# Patient Record
Sex: Female | Born: 1995 | Race: Black or African American | Hispanic: No | Marital: Single | Smoking: Never smoker
Health system: Southern US, Community
[De-identification: ages and names within clinical notes are randomized; demographics above are authoritative.]

## PROBLEM LIST (undated history)

## (undated) DIAGNOSIS — Z789 Other specified health status: Secondary | ICD-10-CM

## (undated) HISTORY — PX: NO PAST SURGERIES: SHX2092

## (undated) HISTORY — PX: OTHER SURGICAL HISTORY: SHX169

## (undated) HISTORY — DX: Other specified health status: Z78.9

---
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
## 2013-01-17 ENCOUNTER — Emergency Department: Payer: Self-pay | Admitting: Emergency Medicine

## 2013-01-17 LAB — URINALYSIS, COMPLETE
Bilirubin,UR: NEGATIVE
Glucose,UR: NEGATIVE mg/dL (ref 0–75)
Ketone: NEGATIVE
Nitrite: NEGATIVE
Protein: 100
RBC,UR: 156 /HPF (ref 0–5)
Squamous Epithelial: 51
WBC UR: 50 /HPF (ref 0–5)

## 2015-01-07 ENCOUNTER — Emergency Department
Admit: 2015-01-07 | Disposition: A | Discharge status: Home / Self Care | Payer: Self-pay | Admitting: Emergency Medicine

## 2016-12-26 ENCOUNTER — Emergency Department
Admission: EM | Admit: 2016-12-26 | Discharge: 2016-12-26 | Disposition: A | Discharge status: Home / Self Care | Payer: Self-pay | Attending: Emergency Medicine | Admitting: Emergency Medicine

## 2016-12-26 ENCOUNTER — Encounter: Payer: Self-pay | Admitting: Emergency Medicine

## 2016-12-26 DIAGNOSIS — N946 Dysmenorrhea, unspecified: Secondary | ICD-10-CM | POA: Insufficient documentation

## 2016-12-26 MED ORDER — APAP-PAMABROM-PYRILAMINE 500-25-15 MG PO TABS: 1.0000 | ORAL_TABLET | Freq: Two times a day (BID) | ORAL | 0 refills | Status: DC | PRN

## 2016-12-26 NOTE — ED Provider Notes (Signed)
[facility] Emergency Department Provider Note  ____________________________________________  Time seen: Approximately 7:19 PM  I have reviewed the triage vital signs and the nursing notes.   HISTORY  Chief Complaint Menstrual Problem    HPI Deborah Strong is a 21 y.o. female who presents emergency department complaining of menstrual cramps. Patient reports that her menstrual cycle occurred on its normal schedule, has not experienced increased her heavy bleeding, no fevers or chills, dysuria, pyuria, hematuria, diarrhea, constipation. Patient reports that her cramps are more severe than normal. She states that she took Aleve earlier which did alleviate her cramps but states "I think I just need a prescription." No complaints at this time.No history of fibroids, endometriosis, ovarian cysts.   History reviewed. No pertinent past medical history.  There are no active problems to display for this patient.   History reviewed. No pertinent surgical history.  Prior to Admission medications   Not on File    Allergies Patient has no known allergies.  History reviewed. No pertinent family history.  Social History Social History  Substance Use Topics  . Smoking status: Never Smoker  . Smokeless tobacco: Never Used  . Alcohol use Yes     Review of Systems  Constitutional: No fever/chills Cardiovascular: no chest pain. Respiratory: no cough. No SOB. Gastrointestinal: No abdominal pain.  No nausea, no vomiting.  No diarrhea.  No constipation. Genitourinary: Negative for dysuria. No hematuria. Positive for menstrual cramps Musculoskeletal: Negative for musculoskeletal pain. Skin: Negative for rash, abrasions, lacerations, ecchymosis. Neurological: Negative for headaches, focal weakness or numbness. 10-point ROS otherwise negative.  ____________________________________________   PHYSICAL EXAM:  VITAL SIGNS: ED Triage Vitals  Enc Vitals Group     BP 12/26/16 1826 126/76     Pulse Rate 12/26/16 1826 89     Resp 12/26/16 1826 19     Temp 12/26/16 1826 99.3 F (37.4 C)     Temp Source 12/26/16 1826 Oral     SpO2 12/26/16 1826 100 %     Weight 12/26/16 1832 180 lb (81.6 kg)     Height 12/26/16 1832 5\' 6"  (1.676 m)     Head Circumference --      Peak Flow --      Pain Score 12/26/16 1832 6     Pain Loc --      Pain Edu? --      Excl. in GC? --      Constitutional: Alert and oriented. Well appearing and in no acute distress. Eyes: Conjunctivae are normal. PERRL. EOMI. Head: Atraumatic. Neck: No stridor.    Cardiovascular: Normal rate, regular rhythm. Normal S1 and S2.  Good peripheral circulation. Respiratory: Normal respiratory effort without tachypnea or retractions. Lungs CTAB. Good air entry to the bases with no decreased or absent breath sounds. Gastrointestinal: Bowel sounds 4 quadrants. Soft and nontender to palpation. No guarding or rigidity. No palpable masses. No distention. No CVA tenderness. Musculoskeletal: Full range of motion to all extremities. No gross deformities appreciated. Neurologic:  Normal speech and language. No gross focal neurologic deficits are appreciated.  Skin:  Skin is warm, dry and intact. No rash noted. Psychiatric: Mood and affect are normal. Speech and behavior are normal. Patient exhibits appropriate insight and judgement.   ____________________________________________   LABS (all labs ordered are listed, but only abnormal results are displayed)  Labs Reviewed - No data to display ____________________________________________  EKG   ____________________________________________  RADIOLOGY   No results found.  ____________________________________________  PROCEDURES  Procedure(s) performed:    Procedures    Medications - No data to display   ____________________________________________   INITIAL IMPRESSION / ASSESSMENT AND PLAN / ED COURSE  Pertinent labs &  imaging results that were available during my care of the patient were reviewed by me and considered in my medical decision making (see chart for details).  Review of the [place] CSRS was performed in accordance of the [facility] prior to dispensing any controlled drugs.     Patient's diagnosis is consistent with menstrual cramps. Patient is here for increased menstrual cramps with no other complaints. Other than cramping, no changes from baseline menses. Exam was reassuring with no indication for imaging or labs. Patient reports that Aleve alleviated her symptoms but she is requesting prescription.. Patient will be discharged home with prescriptions for Pamprin. Patient is to follow up with primary care or OB/GYN as needed or otherwise directed. Patient is given ED precautions to return to the ED for any worsening or new symptoms.     ____________________________________________  FINAL CLINICAL IMPRESSION(S) / ED DIAGNOSES  Final diagnoses:  None      NEW MEDICATIONS STARTED DURING THIS VISIT:  New Prescriptions   No medications on file        This chart was dictated using voice recognition software/Dragon. Despite best efforts to proofread, errors can occur which can change the meaning. Any change was purely unintentional.   Racheal PatchesJonathan D Diania Co, PA-C 12/26/16 1926    Sharman CheekPhillip Stafford, MD 12/26/16 2329

## 2016-12-26 NOTE — ED Notes (Signed)
Pt reports cramps with menses.  Sx began 2 days ago.  Pt states took aleve with some relief.  Pt also reports nausea.  No increase in vag bleeding.  No dizziness   Pt alert.

## 2016-12-26 NOTE — ED Triage Notes (Signed)
Pt presents to ED c/o increased cramping during her menstrual period . Pt states it is more painful than normal, bleeding is the same;denies dizziness. Pt admits to longer cramps than usual. Denies hx of cysts, fibroids. Pt has taken aleve with some relief.

## 2019-10-09 NOTE — L&D Delivery Note (Signed)
       Delivery Note   Deborah Strong is a 24 y.o. G1P0 at [redacted]w[redacted]d Estimated Date of Delivery: 05/13/20  PRE-OPERATIVE DIAGNOSIS:  1) [redacted]w[redacted]d pregnancy.  2) labor  POST-OPERATIVE DIAGNOSIS:  1) [redacted]w[redacted]d pregnancy s/p Vaginal, Spontaneous    Delivery Type: Vaginal, Spontaneous    Delivery Anesthesia: Local   Labor Complications:  Precipitate delivery    ESTIMATED BLOOD LOSS: 200  ml    FINDINGS:   1) female infant, Apgar scores of 8   at 1 minute and 9   at 5 minutes and a birthweight of 109  ounces.    2) Nuchal cord: Arm cord  SPECIMENS:   PLACENTA:   Appearance: Intact    Removal: Spontaneous      Disposition:    DISPOSITION:  Infant to left in stable condition in the delivery room, with L&D personnel and mother,  NARRATIVE SUMMARY: Labor course:  Ms. Deborah Strong is a G1P0 at [redacted]w[redacted]d who presented for labor management.  She progressed well in labor without pitocin.  She received the appropriate anesthesia and proceeded to complete dilation. She evidenced good maternal expulsive effort during the second stage. She went on to deliver a viable infant. The placenta delivered without problems and was noted to be complete. A perineal and vaginal examination was performed. Episiotomy/Lacerations: 1st degree  Episiotomy or lacerations were repaired with Vicryl suture using local anesthesia. The patient received IV stadol prior to repair.  Elonda Husky, M.D. 05/05/2020 5:11 AM

## 2019-10-15 ENCOUNTER — Ambulatory Visit (LOCAL_COMMUNITY_HEALTH_CENTER): Payer: Medicaid Other

## 2019-10-15 ENCOUNTER — Telehealth: Payer: Self-pay | Admitting: Obstetrics and Gynecology

## 2019-10-15 ENCOUNTER — Other Ambulatory Visit: Payer: Self-pay

## 2019-10-15 VITALS — BP 115/80 | Ht 66.0 in | Wt 215.5 lb

## 2019-10-15 DIAGNOSIS — Z3201 Encounter for pregnancy test, result positive: Secondary | ICD-10-CM

## 2019-10-15 LAB — PREGNANCY, URINE: Preg Test, Ur: POSITIVE — AB

## 2019-10-15 NOTE — Telephone Encounter (Signed)
pt  Called in and [facility] lmp 07/27/2020. Pt requested cherry. Where would you let to put her. Please let me know pt was 12 week 1/5/

## 2019-10-15 NOTE — Progress Notes (Signed)
PNV declined as plans to purchase gummy PNV. Client reports LMP 08/18/2019 as 3 days of light spotting. Thinks LNMP was in September 2020. Plans [facility] as [facility] provider. PHQ 9 score = 7. Provider consult offered and client declined. Jossie Ng, RN

## 2019-10-16 NOTE — Telephone Encounter (Signed)
AC has one appointment next week its on a Friday maybe we can get her in to that appointment.  Thanks [employer]

## 2019-10-19 NOTE — Telephone Encounter (Signed)
Pt called in and spoke to dejunna pt schud appt

## 2019-11-04 ENCOUNTER — Other Ambulatory Visit: Payer: Medicaid Other

## 2019-11-04 ENCOUNTER — Other Ambulatory Visit: Payer: Self-pay

## 2019-11-04 ENCOUNTER — Other Ambulatory Visit: Payer: Self-pay | Admitting: Obstetrics and Gynecology

## 2019-11-04 DIAGNOSIS — Z789 Other specified health status: Secondary | ICD-10-CM

## 2019-11-05 ENCOUNTER — Other Ambulatory Visit: Payer: Self-pay

## 2019-11-05 ENCOUNTER — Ambulatory Visit (INDEPENDENT_AMBULATORY_CARE_PROVIDER_SITE_OTHER): Payer: Self-pay

## 2019-11-05 ENCOUNTER — Other Ambulatory Visit: Payer: Medicaid Other

## 2019-11-05 DIAGNOSIS — O3680X0 Pregnancy with inconclusive fetal viability, not applicable or unspecified: Secondary | ICD-10-CM

## 2019-11-05 DIAGNOSIS — Z3A13 13 weeks gestation of pregnancy: Secondary | ICD-10-CM

## 2019-11-05 DIAGNOSIS — Z789 Other specified health status: Secondary | ICD-10-CM

## 2019-11-05 NOTE — Progress Notes (Signed)
Pt present for NOB-PE. Nurse intake information and labs completed today during PE visit. Pt LMP unknown. Pt stated having lower back pain. Pt completed genetic testing blood work today.

## 2019-11-05 NOTE — Patient Instructions (Addendum)
WHAT OB PATIENTS CAN EXPECT   Confirmation of pregnancy and ultrasound ordered if medically indicated-[redacted] weeks gestation  New OB (NOB) intake with nurse and New OB (NOB) labs- [redacted] weeks gestation  New OB (NOB) physical examination with provider- 11/[redacted] weeks gestation  Flu vaccine-[redacted] weeks gestation  Anatomy scan-[redacted] weeks gestation  Glucose tolerance test, blood work to test for anemia, T-dap vaccine-[redacted] weeks gestation  Vaginal swabs/cultures-STD/Group B strep-[redacted] weeks gestation  Appointments every 4 weeks until 28 weeks  Every 2 weeks from 28 weeks until 36 weeks  Weekly visits from 36 weeks until delivery   Second Trimester of Pregnancy  The second trimester is from week 14 through week 27 (month 4 through 6). This is often the time in pregnancy that you feel your best. Often times, morning sickness has lessened or quit. You may have more energy, and you may get hungry more often. Your unborn baby is growing rapidly. At the end of the sixth month, he or she is about 9 inches long and weighs about 1 pounds. You will likely feel the baby move between 18 and 20 weeks of pregnancy. Follow these instructions at home: Medicines  Take over-the-counter and prescription medicines only as told by your doctor. Some medicines are safe and some medicines are not safe during pregnancy.  Take a prenatal vitamin that contains at least 600 micrograms (mcg) of folic acid.  If you have trouble pooping (constipation), take medicine that will make your stool soft (stool softener) if your doctor approves. Eating and drinking   Eat regular, healthy meals.  Avoid raw meat and uncooked cheese.  If you get low calcium from the food you eat, talk to your doctor about taking a daily calcium supplement.  Avoid foods that are high in fat and sugars, such as fried and sweet foods.  If you feel sick to your stomach (nauseous) or throw up (vomit): ? Eat 4 or 5 small meals a day instead of 3 large  meals. ? Try eating a few soda crackers. ? Drink liquids between meals instead of during meals.  To prevent constipation: ? Eat foods that are high in fiber, like fresh fruits and vegetables, whole grains, and beans. ? Drink enough fluids to keep your pee (urine) clear or pale yellow. Activity  Exercise only as told by your doctor. Stop exercising if you start to have cramps.  Do not exercise if it is too hot, too humid, or if you are in a place of great height (high altitude).  Avoid heavy lifting.  Wear low-heeled shoes. Sit and stand up straight.  You can continue to have sex unless your doctor tells you not to. Relieving pain and discomfort  Wear a good support bra if your breasts are tender.  Take warm water baths (sitz baths) to soothe pain or discomfort caused by hemorrhoids. Use hemorrhoid cream if your doctor approves.  Rest with your legs raised if you have leg cramps or low back pain.  If you develop puffy, bulging veins (varicose veins) in your legs: ? Wear support hose or compression stockings as told by your doctor. ? Raise (elevate) your feet for 15 minutes, 3-4 times a day. ? Limit salt in your food. Prenatal care  Write down your questions. Take them to your prenatal visits.  Keep all your prenatal visits as told by your doctor. This is important. Safety  Wear your seat belt when driving.  Make a list of emergency phone numbers, including numbers for family, friends,  the hospital, and police and fire departments. General instructions  Ask your doctor about the right foods to eat or for help finding a counselor, if you need these services.  Ask your doctor about local prenatal classes. Begin classes before month 6 of your pregnancy.  Do not use hot tubs, steam rooms, or saunas.  Do not douche or use tampons or scented sanitary pads.  Do not cross your legs for long periods of time.  Visit your dentist if you have not done so. Use a soft toothbrush  to brush your teeth. Floss gently.  Avoid all smoking, herbs, and alcohol. Avoid drugs that are not approved by your doctor.  Do not use any products that contain nicotine or tobacco, such as cigarettes and e-cigarettes. If you need help quitting, ask your doctor.  Avoid cat litter boxes and soil used by cats. These carry germs that can cause birth defects in the baby and can cause a loss of your baby (miscarriage) or stillbirth. Contact a doctor if:  You have mild cramps or pressure in your lower belly.  You have pain when you pee (urinate).  You have bad smelling fluid coming from your vagina.  You continue to feel sick to your stomach (nauseous), throw up (vomit), or have watery poop (diarrhea).  You have a nagging pain in your belly area.  You feel dizzy. Get help right away if:  You have a fever.  You are leaking fluid from your vagina.  You have spotting or bleeding from your vagina.  You have severe belly cramping or pain.  You lose or gain weight rapidly.  You have trouble catching your breath and have chest pain.  You notice sudden or extreme puffiness (swelling) of your face, hands, ankles, feet, or legs.  You have not felt the baby move in over an hour.  You have severe headaches that do not go away when you take medicine.  You have trouble seeing. Summary  The second trimester is from week 14 through week 27 (months 4 through 6). This is often the time in pregnancy that you feel your best.  To take care of yourself and your unborn baby, you will need to eat healthy meals, take medicines only if your doctor tells you to do so, and do activities that are safe for you and your baby.  Call your doctor if you get sick or if you notice anything unusual about your pregnancy. Also, call your doctor if you need help with the right food to eat, or if you want to know what activities are safe for you. This information is not intended to replace advice given to you by  your health care provider. Make sure you discuss any questions you have with your health care provider. Document Revised: 01/16/2019 Document Reviewed: 10/30/2016 Elsevier Patient Education  2020 [employer]. Prenatal Care Prenatal care is health care during pregnancy. It helps you and your unborn baby (fetus) stay as healthy as possible. Prenatal care may be provided by a midwife, a family practice health care provider, or a childbirth and pregnancy specialist (obstetrician). How does this affect me? During pregnancy, you will be closely monitored for any new conditions that might develop. To lower your risk of pregnancy complications, you and your health care provider will talk about any underlying conditions you have. How does this affect my baby? Early and consistent prenatal care increases the chance that your baby will be healthy during pregnancy. Prenatal care lowers the risk that  your baby will be:  Born early (prematurely).  Smaller than expected at birth (small for gestational age). What can I expect at the first prenatal care visit? Your first prenatal care visit will likely be the longest. You should schedule your first prenatal care visit as soon as you know that you are pregnant. Your first visit is a good time to talk about any questions or concerns you have about pregnancy. At your visit, you and your health care provider will talk about:  Your medical history, including: ? Any past pregnancies. ? Your family's medical history. ? The baby's father's medical history. ? Any long-term (chronic) health conditions you have and how you manage them. ? Any surgeries or procedures you have had. ? Any current over-the-counter or prescription medicines, herbs, or supplements you are taking.  Other factors that could pose a risk to your baby, including:  Your home setting and your stress levels, including: ? Exposure to abuse or violence. ? Household financial strain. ? Mental  health conditions you have.  Your daily health habits, including diet and exercise. Your health care provider will also:  Measure your weight, height, and blood pressure.  Do a physical exam, including a pelvic and breast exam.  Perform blood tests and urine tests to check for: ? Urinary tract infection. ? Sexually transmitted infections (STIs). ? Low iron levels in your blood (anemia). ? Blood type and certain proteins on red blood cells (Rh antibodies). ? Infections and immunity to viruses, such as hepatitis B and rubella. ? HIV (human immunodeficiency virus).  Do an ultrasound to confirm your baby's growth and development and to help predict your estimated due date (EDD). This ultrasound is done with a probe that is inserted into the vagina (transvaginal ultrasound).  Discuss your options for genetic screening.  Give you information about how to keep yourself and your baby healthy, including: ? Nutrition and taking vitamins. ? Physical activity. ? How to manage pregnancy symptoms such as nausea and vomiting (morning sickness). ? Infections and substances that may be harmful to your baby and how to avoid them. ? Food safety. ? Dental care. ? Working. ? Travel. ? Warning signs to watch for and when to call your health care provider. How often will I have prenatal care visits? After your first prenatal care visit, you will have regular visits throughout your pregnancy. The visit schedule is often as follows:  Up to week 28 of pregnancy: once every 4 weeks.  28-36 weeks: once every 2 weeks.  After 36 weeks: every week until delivery. Some women may have visits more or less often depending on any underlying health conditions and the health of the baby. Keep all follow-up and prenatal care visits as told by your health care provider. This is important. What happens during routine prenatal care visits? Your health care provider will:  Measure your weight and blood  pressure.  Check for fetal heart sounds.  Measure the height of your uterus in your abdomen (fundal height). This may be measured starting around week 20 of pregnancy.  Check the position of your baby inside your uterus.  Ask questions about your diet, sleeping patterns, and whether you can feel the baby move.  Review warning signs to watch for and signs of labor.  Ask about any pregnancy symptoms you are having and how you are dealing with them. Symptoms may include: ? Headaches. ? Nausea and vomiting. ? Vaginal discharge. ? Swelling. ? Fatigue. ? Constipation. ? Any discomfort, including  back or pelvic pain. Make a list of questions to ask your health care provider at your routine visits. What tests might I have during prenatal care visits? You may have blood, urine, and imaging tests throughout your pregnancy, such as:  Urine tests to check for glucose, protein, or signs of infection.  Glucose tests to check for a form of diabetes that can develop during pregnancy (gestational diabetes mellitus). This is usually done around week 24 of pregnancy.  An ultrasound to check your baby's growth and development and to check for birth defects. This is usually done around week 20 of pregnancy.  A test to check for group B strep (GBS) infection. This is usually done around week 36 of pregnancy.  Genetic testing. This may include blood or imaging tests, such as an ultrasound. Some genetic tests are done during the first trimester and some are done during the second trimester. What else can I expect during prenatal care visits? Your health care provider may recommend getting certain vaccines during pregnancy. These may include:  A yearly flu shot (annual influenza vaccine). This is especially important if you will be pregnant during flu season.  Tdap (tetanus, diphtheria, pertussis) vaccine. Getting this vaccine during pregnancy can protect your baby from whooping cough (pertussis) after  birth. This vaccine may be recommended between weeks 27 and 36 of pregnancy. Later in your pregnancy, your health care provider may give you information about:  Childbirth and breastfeeding classes.  Choosing a health care provider for your baby.  Umbilical cord banking.  Breastfeeding.  Birth control after your baby is born.  The hospital labor and delivery unit and how to tour it.  Registering at the hospital before you go into labor. Where to find more information  Office on Women's Health: LegalWarrants.gl  American Pregnancy Association: americanpregnancy.org  March of Dimes: marchofdimes.org Summary  Prenatal care helps you and your baby stay as healthy as possible during pregnancy.  Your first prenatal care visit will most likely be the longest.  You will have visits and tests throughout your pregnancy to monitor your health and your baby's health.  Bring a list of questions to your visits to ask your health care provider.  Make sure to keep all follow-up and prenatal care visits with your health care provider. This information is not intended to replace advice given to you by your health care provider. Make sure you discuss any questions you have with your health care provider. Document Revised: 01/14/2019 Document Reviewed: 09/23/2017 Elsevier Patient Education  2020 [employer].   How a Baby Grows During Pregnancy  Pregnancy begins when a female's sperm enters a female's egg (fertilization). Fertilization usually happens in one of the tubes (fallopian tubes) that connect the ovaries to the womb (uterus). The fertilized egg moves down the fallopian tube to the uterus. Once it reaches the uterus, it implants into the lining of the uterus and begins to grow. For the first 10 weeks, the fertilized egg is called an embryo. After 10 weeks, it is called a fetus. As the fetus continues to grow, it receives oxygen and nutrients through tissue (placenta) that grows to  support the developing baby. The placenta is the life support system for the baby. It provides oxygen and nutrition and removes waste. Learning as much as you can about your pregnancy and how your baby is developing can help you enjoy the experience. It can also make you aware of when there might be a problem and when to ask  questions. How long does a typical pregnancy last? A pregnancy usually lasts 280 days, or about 40 weeks. Pregnancy is divided into three periods of growth, also called trimesters:  First trimester: 0-12 weeks.  Second trimester: 13-27 weeks.  Third trimester: 28-40 weeks. The day when your baby is ready to be born (full term) is your estimated date of delivery. How does my baby develop month by month? First month  The fertilized egg attaches to the inside of the uterus.  Some cells will form the placenta. Others will form the fetus.  The arms, legs, brain, spinal cord, lungs, and heart begin to develop.  At the end of the first month, the heart begins to beat. Second month  The bones, inner ear, eyelids, hands, and feet form.  The genitals develop.  By the end of 8 weeks, all major organs are developing. Third month  All of the internal organs are forming.  Teeth develop below the gums.  Bones and muscles begin to grow. The spine can flex.  The skin is transparent.  Fingernails and toenails begin to form.  Arms and legs continue to grow longer, and hands and feet develop.  The fetus is about 3 inches (7.6 cm) long. Fourth month  The placenta is completely formed.  The external sex organs, neck, outer ear, eyebrows, eyelids, and fingernails are formed.  The fetus can hear, swallow, and move its arms and legs.  The kidneys begin to produce urine.  The skin is covered with a white, waxy coating (vernix) and very fine hair (lanugo). Fifth month  The fetus moves around more and can be felt for the first time (quickening).  The fetus starts to  sleep and wake up and may begin to suck its finger.  The nails grow to the end of the fingers.  The organ in the digestive system that makes bile (gallbladder) functions and helps to digest nutrients.  If your baby is a girl, eggs are present in her ovaries. If your baby is a boy, testicles start to move down into his scrotum. Sixth month  The lungs are formed.  The eyes open. The brain continues to develop.  Your baby has fingerprints and toe prints. Your baby's hair grows thicker.  At the end of the second trimester, the fetus is about 9 inches (22.9 cm) long. Seventh month  The fetus kicks and stretches.  The eyes are developed enough to sense changes in light.  The hands can make a grasping motion.  The fetus responds to sound. Eighth month  All organs and body systems are fully developed and functioning.  Bones harden, and taste buds develop. The fetus may hiccup.  Certain areas of the brain are still developing. The skull remains soft. Ninth month  The fetus gains about  lb (0.23 kg) each week.  The lungs are fully developed.  Patterns of sleep develop.  The fetus's head typically moves into a head-down position (vertex) in the uterus to prepare for birth.  The fetus weighs 6-9 lb (2.72-4.08 kg) and is 19-20 inches (48.26-50.8 cm) long. What can I do to have a healthy pregnancy and help my baby develop? General instructions  Take prenatal vitamins as directed by your health care provider. These include vitamins such as folic acid, iron, calcium, and vitamin D. They are important for healthy development.  Take medicines only as directed by your health care provider. Read labels and ask a pharmacist or your health care provider whether over-the-counter medicines, supplements,  and prescription drugs are safe to take during pregnancy.  Keep all follow-up visits as directed by your health care provider. This is important. Follow-up visits include prenatal care and  screening tests. How do I know if my baby is developing well? At each prenatal visit, your health care provider will do several different tests to check on your health and keep track of your baby's development. These include:  Fundal height and position. ? Your health care provider will measure your growing belly from your pubic bone to the top of the uterus using a tape measure. ? Your health care provider will also feel your belly to determine your baby's position.  Heartbeat. ? An ultrasound in the first trimester can confirm pregnancy and show a heartbeat, depending on how far along you are. ? Your health care provider will check your baby's heart rate at every prenatal visit.  Second trimester ultrasound. ? This ultrasound checks your baby's development. It also may show your baby's gender. What should I do if I have concerns about my baby's development? Always talk with your health care provider about any concerns that you may have about your pregnancy and your baby. Summary  A pregnancy usually lasts 280 days, or about 40 weeks. Pregnancy is divided into three periods of growth, also called trimesters.  Your health care provider will monitor your baby's growth and development throughout your pregnancy.  Follow your health care provider's recommendations about taking prenatal vitamins and medicines during your pregnancy.  Talk with your health care provider if you have any concerns about your pregnancy or your developing baby. This information is not intended to replace advice given to you by your health care provider. Make sure you discuss any questions you have with your health care provider. Document Revised: 01/15/2019 Document Reviewed: 08/07/2017 Elsevier Patient Education  [place].   Common Medications Safe in Pregnancy  Acne:      Constipation:  Benzoyl Peroxide     Colace  Clindamycin      Dulcolax Suppository  Topica Erythromycin     Fibercon  Salicylic  Acid      Metamucil         Miralax AVOID:        Senakot   Accutane    Cough:  Retin-A       Cough Drops  Tetracycline      Phenergan w/ Codeine if Rx  Minocycline      Robitussin (Plain & DM)  Antibiotics:     Crabs/Lice:  Ceclor       RID  Cephalosporins    AVOID:  E-Mycins      Kwell  Keflex  Macrobid/Macrodantin   Diarrhea:  Penicillin      Kao-Pectate  Zithromax      Imodium AD         PUSH FLUIDS AVOID:       Cipro     Fever:  Tetracycline      Tylenol (Regular or Extra  Minocycline       Strength)  Levaquin      Extra Strength-Do not          Exceed 8 tabs/24 hrs Caffeine:        <25m/day (equiv. To 1 cup of coffee or  approx. 3 12 oz sodas)         Gas: Cold/Hayfever:       Gas-X  Benadryl      Mylicon  Claritin  Phazyme  **Claritin-D        Chlor-Trimeton    Headaches:  Dimetapp      ASA-Free Excedrin  Drixoral-Non-Drowsy     Cold Compress  Mucinex (Guaifenasin)     Tylenol (Regular or Extra  Sudafed/Sudafed-12 Hour     Strength)  **Sudafed PE Pseudoephedrine   Tylenol Cold & Sinus     Vicks Vapor Rub  Zyrtec  **AVOID if Problems With Blood Pressure    Heartburn: Avoid lying down for at least 1 hour after meals  Aciphex      Maalox     Rash:  Milk of Magnesia     Benadryl    Mylanta       1% Hydrocortisone Cream  Pepcid  Pepcid Complete   Sleep Aids:  Prevacid      Ambien   Prilosec       Benadryl  Rolaids       Chamomile Tea  Tums (Limit 4/day)     Unisom  Zantac       Tylenol PM         Warm milk-add vanilla or  Hemorrhoids:       Sugar for taste  Anusol/Anusol H.C.  (RX: Analapram 2.5%)  Sugar Substitutes:  Hydrocortisone OTC     Ok in moderation  Preparation H      Tucks        Vaseline lotion applied to tissue with wiping    Herpes:     Throat:  Acyclovir      Oragel  Famvir  Valtrex     Vaccines:         Flu Shot Leg Cramps:       *Gardasil  Benadryl      Hepatitis A         Hepatitis B Nasal  Spray:       Pneumovax  Saline Nasal Spray     Polio Booster         Tetanus Nausea:       Tuberculosis test or PPD  Vitamin B6 25 mg TID   AVOID:    Dramamine      *Gardasil  Emetrol       Live Poliovirus  Ginger Root 250 mg QID    MMR (measles, mumps &  High Complex Carbs @ Bedtime    rebella)  Sea Bands-Accupressure    Varicella (Chickenpox)  Unisom 1/2 tab TID     *No known complications           If received before Pain:         Known pregnancy;   Darvocet       Resume series after  Lortab        Delivery  Percocet    Yeast:   Tramadol      Femstat  Tylenol 3      Gyne-lotrimin  Ultram       Monistat  Vicodin           MISC:         All Sunscreens           Hair Coloring/highlights          Insect Repellant's          (Including DEET)         Mystic Tans     Breastfeeding  Choosing to breastfeed is one of the best decisions you can make for yourself and your baby. A change in hormones during pregnancy causes  your breasts to make breast milk in your milk-producing glands. Hormones prevent breast milk from being released before your baby is born. They also prompt milk flow after birth. Once breastfeeding has begun, thoughts of your baby, as well as his or her sucking or crying, can stimulate the release of milk from your milk-producing glands. Benefits of breastfeeding Research shows that breastfeeding offers many health benefits for infants and mothers. It also offers a cost-free and convenient way to feed your baby. For your baby  Your first milk (colostrum) helps your baby's digestive system to function better.  Special cells in your milk (antibodies) help your baby to fight off infections.  Breastfed babies are less likely to develop asthma, allergies, obesity, or type 2 diabetes. They are also at lower risk for sudden infant death syndrome (SIDS).  Nutrients in breast milk are better able to meet your baby's needs compared to infant formula.  Breast milk improves  your baby's brain development. For you  Breastfeeding helps to create a very special bond between you and your baby.  Breastfeeding is convenient. Breast milk costs nothing and is always available at the correct temperature.  Breastfeeding helps to burn calories. It helps you to lose the weight that you gained during pregnancy.  Breastfeeding makes your uterus return faster to its size before pregnancy. It also slows bleeding (lochia) after you give birth.  Breastfeeding helps to lower your risk of developing type 2 diabetes, osteoporosis, rheumatoid arthritis, cardiovascular disease, and breast, ovarian, uterine, and endometrial cancer later in life. Breastfeeding basics Starting breastfeeding  Find a comfortable place to sit or lie down, with your neck and back well-supported.  Place a pillow or a rolled-up blanket under your baby to bring him or her to the level of your breast (if you are seated). Nursing pillows are specially designed to help support your arms and your baby while you breastfeed.  Make sure that your baby's tummy (abdomen) is facing your abdomen.  Gently massage your breast. With your fingertips, massage from the outer edges of your breast inward toward the nipple. This encourages milk flow. If your milk flows slowly, you may need to continue this action during the feeding.  Support your breast with 4 fingers underneath and your thumb above your nipple (make the letter "C" with your hand). Make sure your fingers are well away from your nipple and your baby's mouth.  Stroke your baby's lips gently with your finger or nipple.  When your baby's mouth is open wide enough, quickly bring your baby to your breast, placing your entire nipple and as much of the areola as possible into your baby's mouth. The areola is the colored area around your nipple. ? More areola should be visible above your baby's upper lip than below the lower lip. ? Your baby's lips should be opened and  extended outward (flanged) to ensure an adequate, comfortable latch. ? Your baby's tongue should be between his or her lower gum and your breast.  Make sure that your baby's mouth is correctly positioned around your nipple (latched). Your baby's lips should create a seal on your breast and be turned out (everted).  It is common for your baby to suck about 2-3 minutes in order to start the flow of breast milk. Latching Teaching your baby how to latch onto your breast properly is very important. An improper latch can cause nipple pain, decreased milk supply, and poor weight gain in your baby. Also, if your baby is not  latched onto your nipple properly, he or she may swallow some air during feeding. This can make your baby fussy. Burping your baby when you switch breasts during the feeding can help to get rid of the air. However, teaching your baby to latch on properly is still the best way to prevent fussiness from swallowing air while breastfeeding. Signs that your baby has successfully latched onto your nipple  Silent tugging or silent sucking, without causing you pain. Infant's lips should be extended outward (flanged).  Swallowing heard between every 3-4 sucks once your milk has started to flow (after your let-down milk reflex occurs).  Muscle movement above and in front of his or her ears while sucking. Signs that your baby has not successfully latched onto your nipple  Sucking sounds or smacking sounds from your baby while breastfeeding.  Nipple pain. If you think your baby has not latched on correctly, slip your finger into the corner of your baby's mouth to break the suction and place it between your baby's gums. Attempt to start breastfeeding again. Signs of successful breastfeeding Signs from your baby  Your baby will gradually decrease the number of sucks or will completely stop sucking.  Your baby will fall asleep.  Your baby's body will relax.  Your baby will retain a small  amount of milk in his or her mouth.  Your baby will let go of your breast by himself or herself. Signs from you  Breasts that have increased in firmness, weight, and size 1-3 hours after feeding.  Breasts that are softer immediately after breastfeeding.  Increased milk volume, as well as a change in milk consistency and color by the fifth day of breastfeeding.  Nipples that are not sore, cracked, or bleeding. Signs that your baby is getting enough milk  Wetting at least 1-2 diapers during the first 24 hours after birth.  Wetting at least 5-6 diapers every 24 hours for the first week after birth. The urine should be clear or pale yellow by the age of 5 days.  Wetting 6-8 diapers every 24 hours as your baby continues to grow and develop.  At least 3 stools in a 24-hour period by the age of 5 days. The stool should be soft and yellow.  At least 3 stools in a 24-hour period by the age of 7 days. The stool should be seedy and yellow.  No loss of weight greater than 10% of birth weight during the first 3 days of life.  Average weight gain of 4-7 oz (113-198 g) per week after the age of 4 days.  Consistent daily weight gain by the age of 5 days, without weight loss after the age of 2 weeks. After a feeding, your baby may spit up a small amount of milk. This is normal. Breastfeeding frequency and duration Frequent feeding will help you make more milk and can prevent sore nipples and extremely full breasts (breast engorgement). Breastfeed when you feel the need to reduce the fullness of your breasts or when your baby shows signs of hunger. This is called "breastfeeding on demand." Signs that your baby is hungry include:  Increased alertness, activity, or restlessness.  Movement of the head from side to side.  Opening of the mouth when the corner of the mouth or cheek is stroked (rooting).  Increased sucking sounds, smacking lips, cooing, sighing, or squeaking.  Hand-to-mouth movements  and sucking on fingers or hands.  Fussing or crying. Avoid introducing a pacifier to your baby in the  first 4-6 weeks after your baby is born. After this time, you may choose to use a pacifier. Research has shown that pacifier use during the first year of a baby's life decreases the risk of sudden infant death syndrome (SIDS). Allow your baby to feed on each breast as long as he or she wants. When your baby unlatches or falls asleep while feeding from the first breast, offer the second breast. Because newborns are often sleepy in the first few weeks of life, you may need to awaken your baby to get him or her to feed. Breastfeeding times will vary from baby to baby. However, the following rules can serve as a guide to help you make sure that your baby is properly fed:  Newborns (babies 68 weeks of age or younger) may breastfeed every 1-3 hours.  Newborns should not go without breastfeeding for longer than 3 hours during the day or 5 hours during the night.  You should breastfeed your baby a minimum of 8 times in a 24-hour period. Breast milk pumping     Pumping and storing breast milk allows you to make sure that your baby is exclusively fed your breast milk, even at times when you are unable to breastfeed. This is especially important if you go back to work while you are still breastfeeding, or if you are not able to be present during feedings. Your lactation consultant can help you find a method of pumping that works best for you and give you guidelines about how long it is safe to store breast milk. Caring for your breasts while you breastfeed Nipples can become dry, cracked, and sore while breastfeeding. The following recommendations can help keep your breasts moisturized and healthy:  Avoid using soap on your nipples.  Wear a supportive bra designed especially for nursing. Avoid wearing underwire-style bras or extremely tight bras (sports bras).  Air-dry your nipples for 3-4 minutes after  each feeding.  Use only cotton bra pads to absorb leaked breast milk. Leaking of breast milk between feedings is normal.  Use lanolin on your nipples after breastfeeding. Lanolin helps to maintain your skin's normal moisture barrier. Pure lanolin is not harmful (not toxic) to your baby. You may also hand express a few drops of breast milk and gently massage that milk into your nipples and allow the milk to air-dry. In the first few weeks after giving birth, some women experience breast engorgement. Engorgement can make your breasts feel heavy, warm, and tender to the touch. Engorgement peaks within 3-5 days after you give birth. The following recommendations can help to ease engorgement:  Completely empty your breasts while breastfeeding or pumping. You may want to start by applying warm, moist heat (in the shower or with warm, water-soaked hand towels) just before feeding or pumping. This increases circulation and helps the milk flow. If your baby does not completely empty your breasts while breastfeeding, pump any extra milk after he or she is finished.  Apply ice packs to your breasts immediately after breastfeeding or pumping, unless this is too uncomfortable for you. To do this: ? Put ice in a plastic bag. ? Place a towel between your skin and the bag. ? Leave the ice on for 20 minutes, 2-3 times a day.  Make sure that your baby is latched on and positioned properly while breastfeeding. If engorgement persists after 48 hours of following these recommendations, contact your health care provider or a Science writer. Overall health care recommendations while breastfeeding  Eat  3 healthy meals and 3 snacks every day. Well-nourished mothers who are breastfeeding need an additional 450-500 calories a day. You can meet this requirement by increasing the amount of a balanced diet that you eat.  Drink enough water to keep your urine pale yellow or clear.  Rest often, relax, and continue to  take your prenatal vitamins to prevent fatigue, stress, and low vitamin and mineral levels in your body (nutrient deficiencies).  Do not use any products that contain nicotine or tobacco, such as cigarettes and e-cigarettes. Your baby may be harmed by chemicals from cigarettes that pass into breast milk and exposure to secondhand smoke. If you need help quitting, ask your health care provider.  Avoid alcohol.  Do not use illegal drugs or marijuana.  Talk with your health care provider before taking any medicines. These include over-the-counter and prescription medicines as well as vitamins and herbal supplements. Some medicines that may be harmful to your baby can pass through breast milk.  It is possible to become pregnant while breastfeeding. If birth control is desired, ask your health care provider about options that will be safe while breastfeeding your baby. Where to find more information: [employer]: www.llli.org Contact a health care provider if:  You feel like you want to stop breastfeeding or have become frustrated with breastfeeding.  Your nipples are cracked or bleeding.  Your breasts are red, tender, or warm.  You have: ? Painful breasts or nipples. ? A swollen area on either breast. ? A fever or chills. ? Nausea or vomiting. ? Drainage other than breast milk from your nipples.  Your breasts do not become full before feedings by the fifth day after you give birth.  You feel sad and depressed.  Your baby is: ? Too sleepy to eat well. ? Having trouble sleeping. ? More than 28 week old and wetting fewer than 6 diapers in a 24-hour period. ? Not gaining weight by 62 days of age.  Your baby has fewer than 3 stools in a 24-hour period.  Your baby's skin or the white parts of his or her eyes become yellow. Get help right away if:  Your baby is overly tired (lethargic) and does not want to wake up and feed.  Your baby develops an unexplained  fever. Summary  Breastfeeding offers many health benefits for infant and mothers.  Try to breastfeed your infant when he or she shows early signs of hunger.  Gently tickle or stroke your baby's lips with your finger or nipple to allow the baby to open his or her mouth. Bring the baby to your breast. Make sure that much of the areola is in your baby's mouth. Offer one side and burp the baby before you offer the other side.  Talk with your health care provider or lactation consultant if you have questions or you face problems as you breastfeed. This information is not intended to replace advice given to you by your health care provider. Make sure you discuss any questions you have with your health care provider. Document Revised: 12/19/2017 Document Reviewed: 10/26/2016 Elsevier Patient Education  [place].

## 2019-11-06 ENCOUNTER — Other Ambulatory Visit (HOSPITAL_COMMUNITY)
Admission: RE | Admit: 2019-11-06 | Discharge: 2019-11-06 | Disposition: A | Discharge status: Home / Self Care | Payer: Self-pay | Source: Ambulatory Visit | Attending: Obstetrics and Gynecology | Admitting: Obstetrics and Gynecology

## 2019-11-06 ENCOUNTER — Ambulatory Visit (INDEPENDENT_AMBULATORY_CARE_PROVIDER_SITE_OTHER): Payer: Self-pay | Admitting: Obstetrics and Gynecology

## 2019-11-06 ENCOUNTER — Encounter: Payer: Self-pay | Admitting: Obstetrics and Gynecology

## 2019-11-06 ENCOUNTER — Other Ambulatory Visit: Payer: Self-pay

## 2019-11-06 VITALS — BP 103/68 | HR 89 | Ht 66.0 in | Wt 215.0 lb

## 2019-11-06 DIAGNOSIS — Z3A13 13 weeks gestation of pregnancy: Secondary | ICD-10-CM

## 2019-11-06 DIAGNOSIS — Z0283 Encounter for blood-alcohol and blood-drug test: Secondary | ICD-10-CM

## 2019-11-06 DIAGNOSIS — A5901 Trichomonal vulvovaginitis: Secondary | ICD-10-CM | POA: Insufficient documentation

## 2019-11-06 DIAGNOSIS — Z113 Encounter for screening for infections with a predominantly sexual mode of transmission: Secondary | ICD-10-CM

## 2019-11-06 DIAGNOSIS — Z3402 Encounter for supervision of normal first pregnancy, second trimester: Secondary | ICD-10-CM | POA: Insufficient documentation

## 2019-11-06 DIAGNOSIS — R638 Other symptoms and signs concerning food and fluid intake: Secondary | ICD-10-CM

## 2019-11-06 DIAGNOSIS — Z124 Encounter for screening for malignant neoplasm of cervix: Secondary | ICD-10-CM

## 2019-11-06 LAB — POCT URINALYSIS DIPSTICK OB
Bilirubin, UA: NEGATIVE
Blood, UA: NEGATIVE
Glucose, UA: NEGATIVE
Ketones, UA: NEGATIVE
Nitrite, UA: NEGATIVE
Spec Grav, UA: >=1.03 — AB (ref 1.010–1.025)
Urobilinogen, UA: 0.2 E.U./dL
pH, UA: 6 (ref 5.0–8.0)

## 2019-11-06 NOTE — Progress Notes (Signed)
OBSTETRIC INITIAL PRENATAL VISIT  Subjective:    Deborah Strong is being seen today for her first obstetrical visit.  This is a planned pregnancy. She is a 24 y.o. G1P0 female at [redacted]w[redacted]d gestation, Estimated Date of Delivery: 05/13/20 with Patient's last menstrual period was 08/18/2019 (exact date), consistent with a 12 week sono.  Her obstetrical history is significant for obesity. Relationship with FOB: significant other, not living together. Patient is unsure if she intends to breast feed. Pregnancy history fully reviewed.    OB History  Gravida Para Term Preterm AB Living  1 0 0 0 0 0  SAB TAB Ectopic Multiple Live Births  0 0 0 0 0    # Outcome Date GA Lbr Len/2nd Weight Sex Delivery Anes PTL Lv  1 Current             Gynecologic History:  Last pap smear was: patient does not think she has ever had one.   Denies history of STIs.  Contraception: None   Past Medical History:  Diagnosis Date  . Patient denies medical problems     Family History  Problem Relation Age of Onset  . Healthy Mother   . Healthy Father     Past Surgical History:  Procedure Laterality Date  . no surical history      Social History   Socioeconomic History  . Marital status: Single    Spouse name: Not on file  . Number of children: Not on file  . Years of education: Not on file  . Highest education level: Not on file  Occupational History  . Not on file  Tobacco Use  . Smoking status: Never Smoker  . Smokeless tobacco: Never Used  Substance and Sexual Activity  . Alcohol use: Not Currently    Comment: Last use 2 months ago  . Drug use: Never  . Sexual activity: Yes    Birth control/protection: None  Other Topics Concern  . Not on file  Social History Narrative  . Not on file   Social Determinants of Health   Financial Resource Strain:   . Difficulty of Paying Living Expenses: Not on file  Food Insecurity:   . Worried About Programme researcher, broadcasting/film/video in the Last Year: Not on file   . Ran Out of Food in the Last Year: Not on file  Transportation Needs:   . Lack of Transportation (Medical): Not on file  . Lack of Transportation (Non-Medical): Not on file  Physical Activity:   . Days of Exercise per Week: Not on file  . Minutes of Exercise per Session: Not on file  Stress:   . Feeling of Stress : Not on file  Social Connections:   . Frequency of Communication with Friends and Family: Not on file  . Frequency of Social Gatherings with Friends and Family: Not on file  . Attends Religious Services: Not on file  . Active Member of Clubs or Organizations: Not on file  . Attends Banker Meetings: Not on file  . Marital Status: Not on file  Intimate Partner Violence: Not At Risk  . Fear of Current or Ex-Partner: No  . Emotionally Abused: No  . Physically Abused: No  . Sexually Abused: No    Current Outpatient Medications on File Prior to Visit  Medication Sig Dispense Refill  . Prenatal Vit-Fe Fumarate-FA (MULTIVITAMIN-PRENATAL) 27-0.8 MG TABS tablet Take by mouth daily at 12 noon.     No current facility-administered medications on  file prior to visit.    No Known Allergies   Review of Systems General: Not Present- Fever, Weight Loss and Weight Gain. Skin: Not Present- Rash. HEENT: Not Present- Blurred Vision, Headache and Bleeding Gums. Respiratory: Not Present- Difficulty Breathing. Breast: Not Present- Breast Mass. Cardiovascular: Not Present- Chest Pain, Elevated Blood Pressure, Fainting/ Blacking Out and Shortness of Breath.  Gastrointestinal: Not Present- Abdominal Pain, Constipation, and Vomiting. Positive for nausea (mild) Female Genitourinary: Not Present- Frequency, Painful Urination, Pelvic Pain, Vaginal Bleeding, Vaginal Discharge, Contractions, regular, Fetal Movements Decreased, Urinary Complaints and Vaginal Fluid.  Musculoskeletal: Not Present- Back Pain and Leg Cramps. Neurological: Not Present- Dizziness. Psychiatric: Not  Present- Depression.     Objective:   Blood pressure 103/68, pulse 89, weight 215 lb (97.5 kg), last menstrual period 08/18/2019.  Body mass index is 34.7 kg/m.  General Appearance:    Alert, cooperative, no distress, appears stated age, moderate obesity.   Head:    Normocephalic, without obvious abnormality, atraumatic  Eyes:    PERRL, conjunctiva/corneas clear, EOM's intact, both eyes  Ears:    Normal external ear canals, both ears  Nose:   Nares normal, septum midline, mucosa normal, no drainage or sinus tenderness  Throat:   Lips, mucosa, and tongue normal; teeth and gums normal  Neck:   Supple, symmetrical, trachea midline, no adenopathy; thyroid: no enlargement/tenderness/nodules; no carotid bruit or JVD  Back:     Symmetric, no curvature, ROM normal, no CVA tenderness  Lungs:     Clear to auscultation bilaterally, respirations unlabored  Chest Wall:    No tenderness or deformity   Heart:    Regular rate and rhythm, S1 and S2 normal, no murmur, rub or gallop  Breast Exam:    No tenderness, masses, or nipple abnormality  Abdomen:     Soft, non-tender, bowel sounds active all four quadrants, no masses, no organomegaly.  FH 12 cm.  FHT 144 bpm.  Genitalia:    Pelvic:external genitalia normal, vagina without lesions, discharge, or tenderness, rectovaginal septum  normal. Cervix normal in appearance, no cervical motion tenderness, no adnexal masses or tenderness.  Pregnancy positive findings: uterine enlargement: 12 wk size, nontender.   Rectal:    Normal external sphincter.  No hemorrhoids appreciated. Internal exam not done.   Extremities:   Extremities normal, atraumatic, no cyanosis or edema  Pulses:   2+ and symmetric all extremities  Skin:   Skin color, texture, turgor normal, no rashes or lesions  Lymph nodes:   Cervical, supraclavicular, and axillary nodes normal  Neurologic:   CNII-XII intact, normal strength, sensation and reflexes throughout    Assessment:   1. Encounter  for supervision of normal first pregnancy in second trimester   2. Encounter for drug screening   3. Screening examination for STD (sexually transmitted disease)   4. Increased BMI   5. Cervical cancer screening     Plan:   1. Supervision of normal pregnancy  - Initial labs ordered. - Prenatal vitamins encouraged. - Cervical cancer screening performed.  - Problem list reviewed and updated. - New OB counseling:  The patient has been given an overview regarding routine prenatal care.  Recommendations regarding diet, weight gain, and exercise in pregnancy were given. - Prenatal testing, optional genetic testing, and ultrasound use in pregnancy were reviewed.  Cell-free DNA with Panorama and Horizon ordered.  - Benefits of Breast Feeding were discussed. The patient is encouraged to consider nursing her baby post partum. Handout given.   The patient  has Medicaid.  [facility] Medicaid Risk Screening Form completed today  Follow up in 4 weeks.  50% of 30 min visit spent on counseling and coordination of care.    Rubie Maid, MD [facility]

## 2019-11-07 LAB — URINALYSIS, ROUTINE W REFLEX MICROSCOPIC
Bilirubin, UA: NEGATIVE
Glucose, UA: NEGATIVE
Nitrite, UA: NEGATIVE
RBC, UA: NEGATIVE
Specific Gravity, UA: >=1.03 — AB (ref 1.005–1.030)
Urobilinogen, Ur: 0.2 mg/dL (ref 0.2–1.0)
pH, UA: 5.5 (ref 5.0–7.5)

## 2019-11-07 LAB — MICROSCOPIC EXAMINATION
Casts: NONE SEEN /lpf
Epithelial Cells (non renal): >10 /hpf — AB (ref 0–10)

## 2019-11-07 LAB — HEMOGLOBIN A1C
Est. average glucose Bld gHb Est-mCnc: 105 mg/dL
Hgb A1c MFr Bld: 5.3 % (ref 4.8–5.6)

## 2019-11-07 LAB — RUBELLA SCREEN: Rubella Antibodies, IGG: 1.45 index (ref >0.99)

## 2019-11-07 LAB — TSH: TSH: 0.759 u[IU]/mL (ref 0.450–4.500)

## 2019-11-07 LAB — VARICELLA ZOSTER ANTIBODY, IGG: Varicella zoster IgG: 629 index (ref >165)

## 2019-11-07 LAB — DRUG PROFILE, UR, 9 DRUGS (LABCORP)
Amphetamines, Urine: NEGATIVE ng/mL
Barbiturate Quant, Ur: NEGATIVE ng/mL
Benzodiazepine Quant, Ur: NEGATIVE ng/mL
Cannabinoid Quant, Ur: NEGATIVE ng/mL
Cocaine (Metab.): NEGATIVE ng/mL
Methadone Screen, Urine: NEGATIVE ng/mL
Opiate Quant, Ur: NEGATIVE ng/mL
PCP Quant, Ur: NEGATIVE ng/mL
Propoxyphene: NEGATIVE ng/mL

## 2019-11-07 LAB — TOXOPLASMA ANTIBODIES- IGG AND  IGM
Toxoplasma Antibody- IgM: <3 AU/mL (ref 0.0–7.9)
Toxoplasma IgG Ratio: 9.7 IU/mL — ABNORMAL HIGH (ref 0.0–7.1)

## 2019-11-07 LAB — RPR: RPR Ser Ql: NONREACTIVE

## 2019-11-07 LAB — HIV ANTIBODY (ROUTINE TESTING W REFLEX): HIV Screen 4th Generation wRfx: NONREACTIVE

## 2019-11-07 LAB — ABO AND RH: Rh Factor: POSITIVE

## 2019-11-07 LAB — HGB SOLU + RFLX FRAC: Sickle Solubility Test - HGBRFX: NEGATIVE

## 2019-11-07 LAB — HEPATITIS B SURFACE ANTIGEN: Hepatitis B Surface Ag: NEGATIVE

## 2019-11-07 LAB — NICOTINE SCREEN, URINE: Cotinine Ql Scrn, Ur: NEGATIVE ng/mL

## 2019-11-07 LAB — ANTIBODY SCREEN: Antibody Screen: NEGATIVE

## 2019-11-08 LAB — URINE CULTURE, OB REFLEX

## 2019-11-08 LAB — CULTURE, OB URINE

## 2019-11-08 LAB — GC/CHLAMYDIA PROBE AMP
Chlamydia trachomatis, NAA: NEGATIVE
Neisseria Gonorrhoeae by PCR: NEGATIVE

## 2019-11-09 ENCOUNTER — Telehealth: Payer: Self-pay

## 2019-11-09 LAB — CYTOLOGY - PAP: Diagnosis: NEGATIVE

## 2019-11-09 MED ORDER — METRONIDAZOLE 500 MG PO TABS: ORAL_TABLET | ORAL | 1 refills | Status: DC

## 2019-11-09 NOTE — Telephone Encounter (Signed)
Pt called no answer unable to LM due to no VM picked up. Pt do not have mychart sent up. Medication sent to pharmacy.

## 2019-11-11 ENCOUNTER — Telehealth: Payer: Self-pay

## 2019-11-11 NOTE — Telephone Encounter (Signed)
Patient called in to see abt genetic testing. I informed her that the nurse was trying to contact her. Please call patient

## 2019-11-13 NOTE — Telephone Encounter (Signed)
Pt called no answer LM via VM to call the office to go over test results and genetic testing results.

## 2019-11-16 ENCOUNTER — Telehealth: Payer: Self-pay

## 2019-11-16 NOTE — Telephone Encounter (Signed)
Pt is aware of test results and medication instructions given along with genetic testing results.

## 2019-11-16 NOTE — Telephone Encounter (Signed)
Pt called no answer LM via VM to call the office to go over test results.  

## 2019-11-16 NOTE — Telephone Encounter (Signed)
Pt called no answer lm via vm to call the office to go over test results.

## 2019-12-03 ENCOUNTER — Encounter: Payer: Self-pay | Admitting: Obstetrics and Gynecology

## 2019-12-03 DIAGNOSIS — D563 Thalassemia minor: Secondary | ICD-10-CM | POA: Insufficient documentation

## 2019-12-09 ENCOUNTER — Encounter: Payer: Medicaid Other | Admitting: Obstetrics and Gynecology

## 2019-12-15 ENCOUNTER — Encounter: Payer: Self-pay | Admitting: Obstetrics and Gynecology

## 2019-12-15 ENCOUNTER — Ambulatory Visit (INDEPENDENT_AMBULATORY_CARE_PROVIDER_SITE_OTHER): Payer: Medicaid Other | Admitting: Obstetrics and Gynecology

## 2019-12-15 ENCOUNTER — Other Ambulatory Visit: Payer: Self-pay

## 2019-12-15 VITALS — BP 120/84 | HR 102 | Wt 211.8 lb

## 2019-12-15 DIAGNOSIS — Z3402 Encounter for supervision of normal first pregnancy, second trimester: Secondary | ICD-10-CM

## 2019-12-15 DIAGNOSIS — A599 Trichomoniasis, unspecified: Secondary | ICD-10-CM

## 2019-12-15 DIAGNOSIS — Z3A18 18 weeks gestation of pregnancy: Secondary | ICD-10-CM

## 2019-12-15 LAB — POCT URINALYSIS DIPSTICK OB
Bilirubin, UA: NEGATIVE
Blood, UA: NEGATIVE
Glucose, UA: NEGATIVE
Ketones, UA: NEGATIVE
Leukocytes, UA: NEGATIVE
Nitrite, UA: NEGATIVE
Spec Grav, UA: 1.01 (ref 1.010–1.025)
Urobilinogen, UA: 0.2 E.U./dL
pH, UA: 6.5 (ref 5.0–8.0)

## 2019-12-15 MED ORDER — METRONIDAZOLE 500 MG PO TABS: ORAL_TABLET | ORAL | 0 refills | Status: DC

## 2019-12-15 NOTE — Progress Notes (Signed)
ROB: Has no complaints today.  Desires AFP.  Alpha thalassemia trait discussed in detail.  Patient has not taken medication for trichomonas yet.  She is no longer with her that partner. Rxed.  FAS ultrasound next visit.

## 2019-12-15 NOTE — Progress Notes (Signed)
Patient comes in today for ROB visit today.

## 2019-12-17 LAB — AFP, SERUM, OPEN SPINA BIFIDA
AFP MoM: 1.59
AFP Value: 61 ng/mL
Gest. Age on Collection Date: 18 weeks
Maternal Age At EDD: 26 yr
OSBR Risk 1 IN: 4310
Test Results:: NEGATIVE
Weight: 215 [lb_av]

## 2019-12-21 ENCOUNTER — Telehealth: Payer: Self-pay

## 2019-12-21 NOTE — Telephone Encounter (Signed)
Pt called and was not available was informed by her mother that she was not there. Pt's mother was asked to please informed pt that I had call and to please call [facility] for test results. Pt's mother stated that she understood.

## 2019-12-28 ENCOUNTER — Telehealth: Payer: Self-pay | Admitting: Obstetrics and Gynecology

## 2019-12-28 NOTE — Telephone Encounter (Signed)
Pt called in and stated that she needed to know what pharmacy we sent her meds to.  The pt wants to know if her medications where covered under her insurance I told her I could ask she stated not to worry about it.

## 2019-12-29 ENCOUNTER — Other Ambulatory Visit: Payer: Self-pay

## 2019-12-29 ENCOUNTER — Ambulatory Visit (INDEPENDENT_AMBULATORY_CARE_PROVIDER_SITE_OTHER): Payer: Self-pay

## 2019-12-29 DIAGNOSIS — Z3402 Encounter for supervision of normal first pregnancy, second trimester: Secondary | ICD-10-CM

## 2019-12-29 DIAGNOSIS — Z363 Encounter for antenatal screening for malformations: Secondary | ICD-10-CM

## 2019-12-29 DIAGNOSIS — Z3A20 20 weeks gestation of pregnancy: Secondary | ICD-10-CM

## 2020-01-12 ENCOUNTER — Ambulatory Visit (INDEPENDENT_AMBULATORY_CARE_PROVIDER_SITE_OTHER): Payer: Self-pay | Admitting: Obstetrics and Gynecology

## 2020-01-12 ENCOUNTER — Other Ambulatory Visit: Payer: Self-pay

## 2020-01-12 ENCOUNTER — Encounter: Payer: Self-pay | Admitting: Obstetrics and Gynecology

## 2020-01-12 ENCOUNTER — Other Ambulatory Visit (HOSPITAL_COMMUNITY)
Admission: RE | Admit: 2020-01-12 | Discharge: 2020-01-12 | Disposition: A | Discharge status: Home / Self Care | Payer: Medicaid Other | Source: Ambulatory Visit | Attending: Obstetrics and Gynecology | Admitting: Obstetrics and Gynecology

## 2020-01-12 VITALS — BP 103/72 | HR 99 | Wt 213.0 lb

## 2020-01-12 DIAGNOSIS — A5901 Trichomonal vulvovaginitis: Secondary | ICD-10-CM

## 2020-01-12 DIAGNOSIS — O23592 Infection of other part of genital tract in pregnancy, second trimester: Secondary | ICD-10-CM | POA: Diagnosis present

## 2020-01-12 DIAGNOSIS — Z3A22 22 weeks gestation of pregnancy: Secondary | ICD-10-CM

## 2020-01-12 DIAGNOSIS — Z3402 Encounter for supervision of normal first pregnancy, second trimester: Secondary | ICD-10-CM

## 2020-01-12 LAB — POCT URINALYSIS DIPSTICK OB
Bilirubin, UA: NEGATIVE
Blood, UA: NEGATIVE
Glucose, UA: NEGATIVE
Nitrite, UA: NEGATIVE
Spec Grav, UA: >=1.03 — AB (ref 1.010–1.025)
Urobilinogen, UA: 0.2 E.U./dL
pH, UA: 6 (ref 5.0–8.0)

## 2020-01-12 MED ORDER — VITAFOL GUMMIES 3.33-0.333-34.8 MG PO CHEW: 3.3300 mg | CHEWABLE_TABLET | Freq: Every day | ORAL | 11 refills | Status: DC

## 2020-01-12 NOTE — Progress Notes (Signed)
ROB-Pt present for routine prenatal care. Pt c/o lower back pain and unable to walk sometimes from sitting position.

## 2020-01-12 NOTE — Patient Instructions (Signed)

## 2020-01-12 NOTE — Progress Notes (Signed)
ROB: Patient notes some low back pain and difficulty walking at times. Discussed pregnancy belly band, Tylenol prn. Needs TOC for Trichomonas infection. Reports compliance with medications but did have intercourse the week following treatment. Unsure if partner truly took treatment.  Anatomy scan incomplete, for repeat next week. Discussed breastfeeding.     The following were addressed during this visit:  Breastfeeding Education - Early initiation of breastfeeding    Comments: Keeps milk supply adequate, helps contract uterus and slow bleeding, and early milk is the perfect first food and is easy to digest.   - The importance of exclusive breastfeeding    Comments: Provides antibodies, Lower risk of breast and ovarian cancers, and type-2 diabetes,Helps your body recover, Reduced chance of SIDS.   - Exclusive breastfeeding for the first 6 months    Comments: Builds a healthy milk supply and keeps it up, protects baby from sickness and disease, and breastmilk has everything your baby needs for the first 6 months.

## 2020-01-13 LAB — CERVICOVAGINAL ANCILLARY ONLY
Chlamydia: NEGATIVE
Comment: NEGATIVE
Comment: NEGATIVE
Comment: NORMAL
Neisseria Gonorrhea: NEGATIVE
Trichomonas: NEGATIVE

## 2020-01-18 ENCOUNTER — Telehealth: Payer: Self-pay | Admitting: Obstetrics and Gynecology

## 2020-01-18 NOTE — Telephone Encounter (Signed)
Pt called in and stated that she needs Vitafol gummies sent over to the [facility] on [address]. Please advise

## 2020-01-20 NOTE — Telephone Encounter (Signed)
Pt called no answer spoke to pt's mother. Informed pt's mother that her [facility] had [facility].  Asked pt's mother to please have pt to call the pharmacy to check on the status of the medication.

## 2020-01-21 ENCOUNTER — Telehealth: Payer: Self-pay | Admitting: Obstetrics and Gynecology

## 2020-01-21 ENCOUNTER — Other Ambulatory Visit: Payer: Self-pay

## 2020-01-21 ENCOUNTER — Ambulatory Visit (INDEPENDENT_AMBULATORY_CARE_PROVIDER_SITE_OTHER): Payer: Self-pay

## 2020-01-21 DIAGNOSIS — Z3402 Encounter for supervision of normal first pregnancy, second trimester: Secondary | ICD-10-CM

## 2020-01-21 NOTE — Telephone Encounter (Signed)
Pt called in to see what the status was on her meds. I called back and fh said that she will call the pt and go over her results as well as talk to dr cherry about her meds.

## 2020-01-21 NOTE — Telephone Encounter (Signed)
Pt was checking out and stated that she needs a refill on her metroNIDAZOLE [facility] [address]. Please advise

## 2020-01-21 NOTE — Telephone Encounter (Signed)
Pt called no answer VM full unable to LM. Pt was called to inform her that STD screening came back negative and AC suggested that she could try tea tree oil supp. Or vagisil wipes.

## 2020-01-22 ENCOUNTER — Telehealth: Payer: Self-pay | Admitting: Obstetrics and Gynecology

## 2020-01-22 NOTE — Telephone Encounter (Signed)
Pt called no answer unable to LM due to VM full.

## 2020-01-22 NOTE — Telephone Encounter (Signed)
Pt called in and stated that she missed a call. The pt is requesting a call back. Please advise  °

## 2020-01-27 ENCOUNTER — Other Ambulatory Visit: Payer: Self-pay | Admitting: Obstetrics and Gynecology

## 2020-01-27 DIAGNOSIS — A599 Trichomoniasis, unspecified: Secondary | ICD-10-CM

## 2020-01-27 NOTE — Telephone Encounter (Signed)
Please advise on refill.

## 2020-01-29 ENCOUNTER — Encounter: Payer: Self-pay | Admitting: Obstetrics and Gynecology

## 2020-01-29 ENCOUNTER — Observation Stay
Admission: EM | Admit: 2020-01-29 | Discharge: 2020-01-29 | Disposition: A | Discharge status: Home / Self Care | Payer: Medicaid Other | Attending: Obstetrics and Gynecology | Admitting: Obstetrics and Gynecology

## 2020-01-29 ENCOUNTER — Other Ambulatory Visit: Payer: Self-pay

## 2020-01-29 DIAGNOSIS — Z3A25 25 weeks gestation of pregnancy: Secondary | ICD-10-CM | POA: Diagnosis not present

## 2020-01-29 DIAGNOSIS — O3462 Maternal care for abnormality of vagina, second trimester: Principal | ICD-10-CM | POA: Insufficient documentation

## 2020-01-29 DIAGNOSIS — Z3482 Encounter for supervision of other normal pregnancy, second trimester: Secondary | ICD-10-CM | POA: Diagnosis present

## 2020-01-29 DIAGNOSIS — O26892 Other specified pregnancy related conditions, second trimester: Secondary | ICD-10-CM

## 2020-01-29 DIAGNOSIS — N898 Other specified noninflammatory disorders of vagina: Secondary | ICD-10-CM | POA: Diagnosis not present

## 2020-01-29 HISTORY — DX: Other specified health status: Z78.9

## 2020-01-29 LAB — WET PREP, GENITAL
Clue Cells Wet Prep HPF POC: NONE SEEN
Sperm: NONE SEEN
Trich, Wet Prep: NONE SEEN
Yeast Wet Prep HPF POC: NONE SEEN

## 2020-01-29 NOTE — Final Progress Note (Signed)
L&D OB Triage Note  Deborah Strong is a 24 y.o. G1P0 female at [redacted]w[redacted]d, EDD Estimated Date of Delivery: 05/13/20 who presented to triage for complaints of vaginal discharge, irritation, and itching.  She was evaluated by the nurses with findings of thick white discharge on exam.  Vital signs stable. An NST was performed and has been reviewed by MD.   Fetal Heart Tones observed.   Mode: External Baseline Rate (A): 140 bpm           Contraction Frequency (min): none    Labs:  Results for orders placed or performed during the hospital encounter of 01/29/20  Wet prep, genital   Specimen: Vaginal  Result Value Ref Range   Yeast Wet Prep HPF POC NONE SEEN NONE SEEN   Trich, Wet Prep NONE SEEN NONE SEEN   Clue Cells Wet Prep HPF POC NONE SEEN NONE SEEN   WBC, Wet Prep HPF POC MANY (A) NONE SEEN   Sperm NONE SEEN      Plan: Overall negative wet prep today.  WBCs present. Has also had negative recent STD testing several weeks ago. She was discharged home with discussion of home remedies for vaginal irritaiton.  Continue routine prenatal care. Follow up with OB/GYN as previously scheduled.     Hildred Laser, MD  [facility]

## 2020-01-29 NOTE — OB Triage Note (Signed)
Pt c/o vaginal discharge, irritation, itching. Deborah Strong

## 2020-02-09 ENCOUNTER — Ambulatory Visit (INDEPENDENT_AMBULATORY_CARE_PROVIDER_SITE_OTHER): Payer: Self-pay | Admitting: Obstetrics and Gynecology

## 2020-02-09 ENCOUNTER — Encounter: Payer: Self-pay | Admitting: Obstetrics and Gynecology

## 2020-02-09 ENCOUNTER — Other Ambulatory Visit: Payer: Self-pay

## 2020-02-09 VITALS — BP 101/69 | HR 87 | Wt 212.1 lb

## 2020-02-09 DIAGNOSIS — Z3A24 24 weeks gestation of pregnancy: Secondary | ICD-10-CM

## 2020-02-09 DIAGNOSIS — Z3402 Encounter for supervision of normal first pregnancy, second trimester: Secondary | ICD-10-CM

## 2020-02-09 LAB — POCT URINALYSIS DIPSTICK OB
Bilirubin, UA: NEGATIVE
Blood, UA: NEGATIVE
Glucose, UA: NEGATIVE
Ketones, UA: NEGATIVE
Nitrite, UA: NEGATIVE
Spec Grav, UA: 1.01 (ref 1.010–1.025)
Urobilinogen, UA: 0.2 E.U./dL
pH, UA: 6.5 (ref 5.0–8.0)

## 2020-02-09 NOTE — Progress Notes (Signed)
ROB: Patient states she is taking prenatal vitamins.  Presented to labor and delivery with complaint of vaginal discharge.  Wet prep was negative.  Patient says she has tried over-the-counter solutions proposed to her but these have not worked.  Because of the itching I have recommended a trial of Monistat.  Patient reports daily fetal movement.  1 hour GCT in 2 weeks.  Next visit 3 weeks

## 2020-02-09 NOTE — Patient Instructions (Signed)
Braxton Hicks Contractions °Contractions of the uterus can occur throughout pregnancy, but they are not always a sign that you are in labor. You may have practice contractions called Braxton Hicks contractions. These false labor contractions are sometimes confused with true labor. °What are Braxton Hicks contractions? °Braxton Hicks contractions are tightening movements that occur in the muscles of the uterus before labor. Unlike true labor contractions, these contractions do not result in opening (dilation) and thinning of the cervix. Toward the end of pregnancy (32-34 weeks), Braxton Hicks contractions can happen more often and may become stronger. These contractions are sometimes difficult to tell apart from true labor because they can be very uncomfortable. You should not feel embarrassed if you go to the hospital with false labor. °Sometimes, the only way to tell if you are in true labor is for your health care provider to look for changes in the cervix. The health care provider will do a physical exam and may monitor your contractions. If you are not in true labor, the exam should show that your cervix is not dilating and your water has not broken. °If there are no other health problems associated with your pregnancy, it is completely safe for you to be sent home with false labor. You may continue to have Braxton Hicks contractions until you go into true labor. °How to tell the difference between true labor and false labor °True labor °· Contractions last 30-70 seconds. °· Contractions become very regular. °· Discomfort is usually felt in the top of the uterus, and it spreads to the lower abdomen and low back. °· Contractions do not go away with walking. °· Contractions usually become more intense and increase in frequency. °· The cervix dilates and gets thinner. °False labor °· Contractions are usually shorter and not as strong as true labor contractions. °· Contractions are usually irregular. °· Contractions  are often felt in the front of the lower abdomen and in the groin. °· Contractions may go away when you walk around or change positions while lying down. °· Contractions get weaker and are shorter-lasting as time goes on. °· The cervix usually does not dilate or become thin. °Follow these instructions at home: ° °· Take over-the-counter and prescription medicines only as told by your health care provider. °· Keep up with your usual exercises and follow other instructions from your health care provider. °· Eat and drink lightly if you think you are going into labor. °· If Braxton Hicks contractions are making you uncomfortable: °? Change your position from lying down or resting to walking, or change from walking to resting. °? Sit and rest in a tub of warm water. °? Drink enough fluid to keep your urine pale yellow. Dehydration may cause these contractions. °? Do slow and deep breathing several times an hour. °· Keep all follow-up prenatal visits as told by your health care provider. This is important. °Contact a health care provider if: °· You have a fever. °· You have continuous pain in your abdomen. °Get help right away if: °· Your contractions become stronger, more regular, and closer together. °· You have fluid leaking or gushing from your vagina. °· You pass blood-tinged mucus (bloody show). °· You have bleeding from your vagina. °· You have low back pain that you never had before. °· You feel your baby’s head pushing down and causing pelvic pressure. °· Your baby is not moving inside you as much as it used to. °Summary °· Contractions that occur before labor are   called Braxton Hicks contractions, false labor, or practice contractions. °· Braxton Hicks contractions are usually shorter, weaker, farther apart, and less regular than true labor contractions. True labor contractions usually become progressively stronger and regular, and they become more frequent. °· Manage discomfort from Braxton Hicks contractions  by changing position, resting in a warm bath, drinking plenty of water, or practicing deep breathing. °This information is not intended to replace advice given to you by your health care provider. Make sure you discuss any questions you have with your health care provider. °Document Revised: 09/06/2017 Document Reviewed: 02/07/2017 °Elsevier Patient Education © 2020 Elsevier Inc. ° °

## 2020-02-11 ENCOUNTER — Telehealth: Payer: Self-pay

## 2020-02-11 NOTE — Telephone Encounter (Signed)
Pt is requesting a referral to dermatology. Irritation in vaginal area.   Unsure of name. ON [address] in [place].   Pt only has [place] medicaid. Informed her that they will not pay for derm appt.   Pls  Advise.

## 2020-02-14 ENCOUNTER — Other Ambulatory Visit: Payer: Self-pay

## 2020-02-14 ENCOUNTER — Inpatient Hospital Stay
Admission: EM | Admit: 2020-02-14 | Discharge: 2020-02-15 | Disposition: A | Discharge status: Home / Self Care | Payer: Medicaid Other | Attending: Obstetrics and Gynecology | Admitting: Obstetrics and Gynecology

## 2020-02-14 DIAGNOSIS — O4702 False labor before 37 completed weeks of gestation, second trimester: Secondary | ICD-10-CM | POA: Diagnosis present

## 2020-02-14 DIAGNOSIS — Z3A27 27 weeks gestation of pregnancy: Secondary | ICD-10-CM | POA: Insufficient documentation

## 2020-02-14 NOTE — Telephone Encounter (Signed)
Ask if she has tried the natural remedies recommended, such as Tea tree oil suppositories or coconut oil.  I know she has been feeling like she has infection but her vaginal cultures have been negative.   Dr. Valentino Saxon

## 2020-02-15 ENCOUNTER — Encounter: Payer: Self-pay | Admitting: Obstetrics and Gynecology

## 2020-02-15 ENCOUNTER — Telehealth: Payer: Self-pay | Admitting: Obstetrics and Gynecology

## 2020-02-15 DIAGNOSIS — Z3A27 27 weeks gestation of pregnancy: Secondary | ICD-10-CM

## 2020-02-15 DIAGNOSIS — R109 Unspecified abdominal pain: Secondary | ICD-10-CM | POA: Diagnosis present

## 2020-02-15 DIAGNOSIS — O4702 False labor before 37 completed weeks of gestation, second trimester: Secondary | ICD-10-CM

## 2020-02-15 LAB — URINALYSIS, ROUTINE W REFLEX MICROSCOPIC
Bilirubin Urine: NEGATIVE
Glucose, UA: NEGATIVE mg/dL
Hgb urine dipstick: NEGATIVE
Ketones, ur: NEGATIVE mg/dL
Nitrite: NEGATIVE
Protein, ur: NEGATIVE mg/dL
Specific Gravity, Urine: 1.023 (ref 1.005–1.030)
pH: 6 (ref 5.0–8.0)

## 2020-02-15 LAB — WET PREP, GENITAL
Clue Cells Wet Prep HPF POC: NONE SEEN
Sperm: NONE SEEN
Trich, Wet Prep: NONE SEEN
Yeast Wet Prep HPF POC: NONE SEEN

## 2020-02-15 LAB — URINE DRUG SCREEN, QUALITATIVE (ARMC ONLY)
Amphetamines, Ur Screen: NOT DETECTED
Barbiturates, Ur Screen: NOT DETECTED
Benzodiazepine, Ur Scrn: NOT DETECTED
Cannabinoid 50 Ng, Ur ~~LOC~~: NOT DETECTED
Cocaine Metabolite,Ur ~~LOC~~: NOT DETECTED
MDMA (Ecstasy)Ur Screen: NOT DETECTED
Methadone Scn, Ur: NOT DETECTED
Opiate, Ur Screen: NOT DETECTED
Phencyclidine (PCP) Ur S: NOT DETECTED
Tricyclic, Ur Screen: NOT DETECTED

## 2020-02-15 MED ORDER — NIFEDIPINE ER OSMOTIC RELEASE 30 MG PO TB24: 30.0000 mg | ORAL_TABLET | Freq: Every day | ORAL | Status: AC

## 2020-02-15 MED ORDER — LACTATED RINGERS IV BOLUS
1000.0000 mL | Freq: Once | INTRAVENOUS | Status: AC
  Administered 2020-02-15: 09:00:00 1000 mL via INTRAVENOUS

## 2020-02-15 MED ORDER — NIFEDIPINE 10 MG PO CAPS: 30.0000 mg | ORAL_CAPSULE | Freq: Once | ORAL | Status: DC

## 2020-02-15 MED ORDER — LACTATED RINGERS IV SOLN: INTRAVENOUS | Status: DC

## 2020-02-15 MED ORDER — TERBUTALINE SULFATE 1 MG/ML IJ SOLN
0.2500 mg | Freq: Once | INTRAMUSCULAR | Status: AC
  Administered 2020-02-15: 03:00:00 0.25 mg via SUBCUTANEOUS
  Filled 2020-02-15: qty 1

## 2020-02-15 MED ORDER — NIFEDIPINE 10 MG PO CAPS: 10.0000 mg | ORAL_CAPSULE | Freq: Three times a day (TID) | ORAL | Status: DC

## 2020-02-15 MED ORDER — NIFEDIPINE 10 MG PO CAPS: 10.0000 mg | ORAL_CAPSULE | Freq: Three times a day (TID) | ORAL | 0 refills | Status: DC

## 2020-02-15 MED ORDER — NIFEDIPINE ER OSMOTIC RELEASE 30 MG PO TB24
ORAL_TABLET | ORAL | Status: AC
  Administered 2020-02-15: 30 mg via ORAL
  Filled 2020-02-15: qty 1

## 2020-02-15 NOTE — Discharge Summary (Addendum)
Obstetric Discharge Summary Reason for Admission: observation/evaluation for preterm contractions, rule out preterm labor at [redacted]w[redacted]d weeks gestation Prenatal Procedures: continuous fetal monitoring, IVF, administration of terbutaline, and Procardia Complications: none     Physical Exam:  General: alert and no distress  CVS: S1 and S2 regular Pulm: clear to auscultation Abdomen: gravid uterus, non-tender.  Pelvis: closed cervix per Dr. Logan Bores and nursing exam DVT Evaluation: No evidence of DVT seen on physical exam. Negative Homan's sign. No cords or calf tenderness. No significant calf/ankle edema.     Fetus A Non-Stress Test Interpretation for 02/15/20  Indication: preterm uterine contractions  Fetal Heart Rate A Mode: External Baseline Rate (A): 138 bpm Variability: Moderate Accelerations: 15 x 15 Decelerations: None Multiple birth?: No  Uterine Activity Mode: Toco Contraction Frequency (min): 1.5-4 Contraction Duration (sec): 20-60 Contraction Quality: Mild Resting Tone Palpated: Relaxed Resting Time: Adequate     Labs:  Results for orders placed or performed during the hospital encounter of 02/14/20  Wet prep, genital  Result Value Ref Range   Yeast Wet Prep HPF POC NONE SEEN NONE SEEN   Trich, Wet Prep NONE SEEN NONE SEEN   Clue Cells Wet Prep HPF POC NONE SEEN NONE SEEN   WBC, Wet Prep HPF POC FEW (A) NONE SEEN   Sperm NONE SEEN   Urine Drug Screen, Qualitative ([facility] only)  Result Value Ref Range   Tricyclic, Ur Screen NONE DETECTED NONE DETECTED   Amphetamines, Ur Screen NONE DETECTED NONE DETECTED   MDMA (Ecstasy)Ur Screen NONE DETECTED NONE DETECTED   Cocaine Metabolite,Ur [place] NONE DETECTED NONE DETECTED   Opiate, Ur Screen NONE DETECTED NONE DETECTED   Phencyclidine (PCP) Ur S NONE DETECTED NONE DETECTED   Cannabinoid 50 Ng, Ur [place] NONE DETECTED NONE DETECTED   Barbiturates, Ur Screen NONE DETECTED NONE DETECTED   Benzodiazepine, Ur Scrn NONE DETECTED  NONE DETECTED   Methadone Scn, Ur NONE DETECTED NONE DETECTED  Urinalysis, Routine w reflex microscopic  Result Value Ref Range   Color, Urine YELLOW (A) YELLOW   APPearance CLOUDY (A) CLEAR   Specific Gravity, Urine 1.023 1.005 - 1.030   pH 6.0 5.0 - 8.0   Glucose, UA NEGATIVE NEGATIVE mg/dL   Hgb urine dipstick NEGATIVE NEGATIVE   Bilirubin Urine NEGATIVE NEGATIVE   Ketones, ur NEGATIVE NEGATIVE mg/dL   Protein, ur NEGATIVE NEGATIVE mg/dL   Nitrite NEGATIVE NEGATIVE   Leukocytes,Ua TRACE (A) NEGATIVE   RBC / HPF 0-5 0 - 5 RBC/hpf   WBC, UA 0-5 0 - 5 WBC/hpf   Bacteria, UA RARE (A) NONE SEEN   Squamous Epithelial / LPF 21-50 0 - 5   Mucus PRESENT     Discharge Diagnoses: Preterm uterine contractions, ruled out for preterm labor  Discharge Information: Date: 02/15/2020 Activity: pelvic rest Diet: routine Medications: PNV and Tylenol prn, and Procardia 10 mg q 8 hrs . Condition: stable Instructions: Follow up with OB/GYN within the next several days Discharge to: home     Hildred Laser, MD 02/15/2020, 11:09 AM

## 2020-02-15 NOTE — OB Triage Note (Signed)
Pt. Presents in triage this evening with c/o of CTX x1 week.  They have worsened over the course of the week.  +FM, no VB, no LOF

## 2020-02-15 NOTE — Progress Notes (Signed)
    L&D OB Triage Note  SUBJECTIVE Deborah Strong is a 24 y.o. G1P0 female at [redacted]w[redacted]d, EDD Estimated Date of Delivery: 05/13/20 who presented to triage with complaints of contractions for a week.  Denies VB, denies ROM.  OB History  Gravida Para Term Preterm AB Living  1 0 0 0 0 0  SAB TAB Ectopic Multiple Live Births  0 0 0 0 0    # Outcome Date GA Lbr Len/2nd Weight Sex Delivery Anes PTL Lv  1 Current             Medications Prior to Admission  Medication Sig Dispense Refill Last Dose  . Prenatal Vit-Fe Fumarate-FA (MULTIVITAMIN-PRENATAL) 27-0.8 MG TABS tablet Take by mouth daily at 12 noon.   02/14/2020 at Unknown time  . metroNIDAZOLE (FLAGYL) 500 MG tablet Take 4 tablets by mouth at once. (Patient not taking: Reported on 01/12/2020) 4 tablet 1   . metroNIDAZOLE (FLAGYL) 500 MG tablet Take two tablets by mouth twice a day, for one day.  Or you can take all four tablets at once if you can tolerate it. (Patient not taking: Reported on 01/12/2020) 4 tablet 0   . Prenatal Vit-Fe Phos-FA-Omega (VITAFOL GUMMIES) 3.33-0.333-34.8 MG CHEW Chew 3.33 mg by mouth daily. Pt take 3 gummies once daily. (Patient not taking: Reported on 01/29/2020) 90 tablet 11      OBJECTIVE  Nursing Evaluation:   BP 113/68 (BP Location: Left Arm)   Pulse 94   Temp 98.4 F (36.9 C) (Oral)   Resp 15   Ht 5\' 7"  (1.702 m)   Wt 96.2 kg   LMP 08/18/2019 (Exact Date)   BMI 33.20 kg/m    Findings:        Contractions difficult to monitor but Q2-6 min, pt feeling them     CVX: by NE = closed thick           NST was performed and has been reviewed by me.  NST INTERPRETATION: Appropriate for gestational age  Mode: External Baseline Rate (A): 145 bpm(fht) Variability: Moderate Accelerations: 15 x 15 Decelerations: None     Contraction Frequency (min): 1.5-4  ASSESSMENT Impression:  1.  Pregnancy:  G1P0 at [redacted]w[redacted]d , EDD Estimated Date of Delivery: 05/13/20 2.  Contractions Q 2-5 min  (pre-term ctx) 3.  U/A,  Tox scr, wet prep - Negative.  PLAN 1. Discussed current condition and above findings with patient. 2. Procardia given 3. Will re-check cervix - consider Mag, Steroids etc if cervical change.

## 2020-02-15 NOTE — Telephone Encounter (Signed)
Called pt to see which medication is was referring to see since I only seen 1 medication that was prescribed by [facility] which was the Procardia. Will call pharmacy to check on the status of the pt medication. Pt stated that she was informed to call our office to let someone that she needed to change her medicaid from family planning to pregnancy medicaid. Pt was informed that she needed to call the SS office for assistance with making those changes.

## 2020-02-15 NOTE — Discharge Instructions (Signed)
Preventing Preterm Birth Preterm birth is when your baby is delivered between 20 weeks and 37 weeks of pregnancy. A full-term pregnancy lasts for at least 37 weeks. Preterm birth can be dangerous for your baby because the last few weeks of pregnancy are an important time for your baby's brain and lungs to grow. Many things can cause a baby to be born early. Sometimes the cause is not known. There are certain factors that make you more likely to experience preterm birth, such as:  Having a previous baby born preterm.  Being pregnant with twins or other multiples.  Having had fertility treatment.  Being overweight or underweight at the start of your pregnancy.  Having any of the following during pregnancy: ? An infection, including a urinary tract infection (UTI) or an STI (sexually transmitted infection). ? High blood pressure. ? Diabetes. ? Vaginal bleeding.  Being age 35 or older.  Being age 18 or younger.  Getting pregnant within 6 months of a previous pregnancy.  Suffering extreme stress or physical or emotional abuse during pregnancy.  Standing for long periods of time during pregnancy, such as working at a job that requires standing. What are the risks? The most serious risk of preterm birth is that the baby may not survive. This is more likely to happen if a baby is born before 34 weeks. Other risks and complications of preterm birth may include your baby having:  Breathing problems.  Brain damage that affects movement and coordination (cerebral palsy).  Feeding difficulties.  Vision or hearing problems.  Infections or inflammation of the digestive tract (colitis).  Developmental delays.  Learning disabilities.  Higher risk for diabetes, heart disease, and high blood pressure later in life. What can I do to lower my risk?  Medical care The most important thing you can do to lower your risk for preterm birth is to get routine medical care during pregnancy (prenatal  care). If you have a high risk of preterm birth, you may be referred to a health care provider who specializes in managing high-risk pregnancies (perinatologist). You may be given medicine to help prevent preterm birth. Lifestyle changes Certain lifestyle changes can also lower your risk of preterm birth:  Wait at least 6 months after a pregnancy to become pregnant again.  Try to plan pregnancy for when you are between 19 and 35 years old.  Get to a healthy weight before getting pregnant. If you are overweight, work with your health care provider to safely lose weight.  Do not use any products that contain nicotine or tobacco, such as cigarettes and e-cigarettes. If you need help quitting, ask your health care provider.  Do not drink alcohol.  Do not use drugs. Where to find support For more support, consider:  Talking with your health care provider.  Talking with a therapist or substance abuse counselor, if you need help quitting.  Working with a diet and nutrition specialist (dietitian) or a personal trainer to maintain a healthy weight.  Joining a support group. Where to find more information Learn more about preventing preterm birth from:  Centers for Disease Control and Prevention: cdc.gov/reproductivehealth/maternalinfanthealth/pretermbirth.htm  March of Dimes: marchofdimes.org/complications/premature-babies.aspx  American Pregnancy Association: americanpregnancy.org/labor-and-birth/premature-labor Contact a health care provider if:  You have any of the following signs of preterm labor before 37 weeks: ? A change or increase in vaginal discharge. ? Fluid leaking from your vagina. ? Pressure or cramps in your lower abdomen. ? A backache that does not go away or gets worse. ?   Regular tightening (contractions) in your lower abdomen. Summary  Preterm birth means having your baby during weeks 20-37 of pregnancy.  Preterm birth may put your baby at risk for physical and  mental problems.  Getting good prenatal care can help prevent preterm birth.  You can lower your risk of preterm birth by making certain lifestyle changes, such as not smoking and not using alcohol. This information is not intended to replace advice given to you by your health care provider. Make sure you discuss any questions you have with your health care provider. Document Revised: 09/06/2017 Document Reviewed: 06/02/2016 Elsevier Patient Education  2020 [employer].   Preterm Labor and Birth Information Pregnancy normally lasts 39-41 weeks. Preterm labor is when labor starts early. It starts before you have been pregnant for 37 whole weeks. What are the risk factors for preterm labor? Preterm labor is more likely to occur in women who:  Have an infection while pregnant.  Have a cervix that is short.  Have gone into preterm labor before.  Have had surgery on their cervix.  Are younger than age 14.  Are older than age 61.  Are African American.  Are pregnant with two or more babies.  Take street drugs while pregnant.  Smoke while pregnant.  Do not gain enough weight while pregnant.  Got pregnant right after another pregnancy. What are the symptoms of preterm labor? Symptoms of preterm labor include:  Cramps. The cramps may feel like the cramps some women get during their period. The cramps may happen with watery poop (diarrhea).  Pain in the belly (abdomen).  Pain in the lower back.  Regular contractions or tightening. It may feel like your belly is getting tighter.  Pressure in the lower belly that seems to get stronger.  More fluid (discharge) leaking from the vagina. The fluid may be watery or bloody.  Water breaking. Why is it important to notice signs of preterm labor? Babies who are born early may not be fully developed. They have a higher chance for:  Long-term heart problems.  Long-term lung problems.  Trouble controlling body systems, like  breathing.  Bleeding in the brain.  A condition called cerebral palsy.  Learning difficulties.  Death. These risks are highest for babies who are born before 34 weeks of pregnancy. How is preterm labor treated? Treatment depends on:  How long you were pregnant.  Your condition.  The health of your baby. Treatment may involve:  Having a stitch (suture) placed in your cervix. When you give birth, your cervix opens so the baby can come out. The stitch keeps the cervix from opening too soon.  Staying at the hospital.  Taking or getting medicines, such as: ? Hormone medicines. ? Medicines to stop contractions. ? Medicines to help the baby's lungs develop. ? Medicines to prevent your baby from having cerebral palsy. What should I do if I am in preterm labor? If you think you are going into labor too soon, call your doctor right away. How can I prevent preterm labor?  Do not use any tobacco products. ? Examples of these are cigarettes, chewing tobacco, and e-cigarettes. ? If you need help quitting, ask your doctor.  Do not use street drugs.  Do not use any medicines unless you ask your doctor if they are safe for you.  Talk with your doctor before taking any herbal supplements.  Make sure you gain enough weight.  Watch for infection. If you think you might have an infection, get  it checked right away.  If you have gone into preterm labor before, tell your doctor. This information is not intended to replace advice given to you by your health care provider. Make sure you discuss any questions you have with your health care provider. Document Revised: 01/16/2019 Document Reviewed: 02/15/2016 Elsevier Patient Education  2020 [employer].

## 2020-02-15 NOTE — Telephone Encounter (Signed)
Patient called in saying she was discharged from the ED and she went over to her pharmacy and they told her that her insurance wouldn't cover them. Could you please advise?

## 2020-02-15 NOTE — Progress Notes (Signed)
Logan Bores MD notified of Uterine Activity. Pt contracting every 1.5-6 minutes and rating them 10/10. Pt was given terbutaline at 0233 and pt reports no noticeable change. Verbal order given for 30 mg Procardia and provider will be at bedside around 0730 this AM.

## 2020-02-15 NOTE — Progress Notes (Signed)
Pt says contractions not as strong.  Cvx - remains closed but head palpates low in the pelvis.  A: Preterm contractions  P:  Continue to follow contractions/cervical change.

## 2020-02-15 NOTE — Progress Notes (Signed)
Monica monitor applied to patient in order to assess FHT and Ctx pattern. Pt is breathing through contractions and rating them 10/10.

## 2020-02-16 MED ORDER — HYDROXYZINE PAMOATE 25 MG PO CAPS
25.0000 mg | ORAL_CAPSULE | Freq: Three times a day (TID) | ORAL | 0 refills | Status: DC
Start: 2020-02-16 — End: 2020-03-22

## 2020-02-16 NOTE — Addendum Note (Signed)
Addended by: Fabian November on: 02/16/2020 05:26 PM   Modules accepted: Orders

## 2020-02-16 NOTE — Telephone Encounter (Signed)
Contacted patient regarding issues with insurance not covering her Nifedipine for preterm contractions. On review, patient has Family planning Medicaid and not pregnancy Medicaid.  Will have social worker check in to getting plan switched.  Until then, will attempt to prescribe Vistaril for contractions. Patient notes she has not had as much pain or many contractions since leaving the hospital yesterday. Takes Tylenol as needed.  Advised to follow up in clinic as previously recommended, or sooner if symptoms worsen.    Hildred Laser, MD [facility]

## 2020-02-16 NOTE — Telephone Encounter (Signed)
Pt was unable to pick up her medication Procardia due to she has only has family planning and not pregnancy medicaid. Please advise on what medication the pt needs. Thanks [employer]

## 2020-02-23 ENCOUNTER — Other Ambulatory Visit: Payer: Medicaid Other

## 2020-02-23 ENCOUNTER — Encounter: Payer: Medicaid Other | Admitting: Obstetrics and Gynecology

## 2020-03-02 ENCOUNTER — Ambulatory Visit (INDEPENDENT_AMBULATORY_CARE_PROVIDER_SITE_OTHER): Payer: Self-pay | Admitting: Obstetrics and Gynecology

## 2020-03-02 ENCOUNTER — Encounter: Payer: Self-pay | Admitting: Obstetrics and Gynecology

## 2020-03-02 ENCOUNTER — Other Ambulatory Visit: Payer: Medicaid Other

## 2020-03-02 ENCOUNTER — Other Ambulatory Visit: Payer: Self-pay

## 2020-03-02 VITALS — BP 97/62 | HR 84 | Wt 214.8 lb

## 2020-03-02 DIAGNOSIS — Z3403 Encounter for supervision of normal first pregnancy, third trimester: Secondary | ICD-10-CM

## 2020-03-02 DIAGNOSIS — Z23 Encounter for immunization: Secondary | ICD-10-CM

## 2020-03-02 DIAGNOSIS — R102 Pelvic and perineal pain: Secondary | ICD-10-CM

## 2020-03-02 DIAGNOSIS — Z3A29 29 weeks gestation of pregnancy: Secondary | ICD-10-CM

## 2020-03-02 LAB — POCT URINALYSIS DIPSTICK OB
Bilirubin, UA: NEGATIVE
Blood, UA: NEGATIVE
Glucose, UA: NEGATIVE
Ketones, UA: NEGATIVE
Nitrite, UA: NEGATIVE
Spec Grav, UA: 1.015 (ref 1.010–1.025)
Urobilinogen, UA: 0.2 E.U./dL
pH, UA: 7 (ref 5.0–8.0)

## 2020-03-02 MED ORDER — TETANUS-DIPHTH-ACELL PERTUSSIS 5-2.5-18.5 LF-MCG/0.5 IM SUSP
0.5000 mL | Freq: Once | INTRAMUSCULAR | Status: AC
  Administered 2020-03-02: 0.5 mL via INTRAMUSCULAR

## 2020-03-02 NOTE — Progress Notes (Signed)
ROB-Pt present for routine prenatal care, BTC, tdap and 28 weeks labs. Pt stated having tightness in abd area and vaginal pressure. Pt stated noticing vaginal blood spotting once after her visit to the ED on 02/14/2020.

## 2020-03-02 NOTE — Progress Notes (Signed)
ROB: Patient notes she is doing fine since admission ~ 2 weeks ago for preterm contractions.  No longer taking Procardia. Is feeling more pelvic pressure, recommend belly band. For 28 week labs today.  Desires to breast feed (pump),  Desires either OCPs for contraception. For Tdap today, signed blood consent. Plans to labor naturally. RTC in 2 weeks.    The following were addressed during this visit:  Breastfeeding Education - Risks of giving your baby anything other than breast milk if you are breastfeeding    Comments: Make the baby less content with breastfeeds, may make my baby more susceptible to illness, and may reduce my milk supply.   - Nonpharmacological pain relief methods for labor    Comments: Deep breathing, focusing on pleasant things, movement and walking, heating pads or cold compress, massage and relaxation, continuous support from someone you trust, and Doulas   - The importance of early skin-to-skin contact    Comments: Keeps baby warm and secure, helps keep baby's blood sugar up and breathing steady, easier to bond and breastfeed, and helps calm baby.  - Rooming-in on a 24-hour basis    Comments: Easier to learn baby's feeding cues, easier to bond and get to know each other, and encourages milk production.   - Frequent feeding to help assure optimal milk production    Comments: Making a full supply of milk requires frequent removal of milk from breasts, infant will eat 8-12 times in 24 hours, if separated from infant use breast massage, hand expression and/ or pumping to remove milk from breasts.

## 2020-03-02 NOTE — Patient Instructions (Signed)
https://www.cdc.gov/vaccines/hcp/vis/vis-statements/tdap.pdf">  Tdap (Tetanus, Diphtheria, Pertussis) Vaccine: What You Need to Know 1. Why get vaccinated? Tdap vaccine can prevent tetanus, diphtheria, and pertussis. Diphtheria and pertussis spread from person to person. Tetanus enters the body through cuts or wounds.  TETANUS (T) causes painful stiffening of the muscles. Tetanus can lead to serious health problems, including being unable to open the mouth, having trouble swallowing and breathing, or death.  DIPHTHERIA (D) can lead to difficulty breathing, heart failure, paralysis, or death.  PERTUSSIS (aP), also known as "whooping cough," can cause uncontrollable, violent coughing which makes it hard to breathe, eat, or drink. Pertussis can be extremely serious in babies and young children, causing pneumonia, convulsions, brain damage, or death. In teens and adults, it can cause weight loss, loss of bladder control, passing out, and rib fractures from severe coughing. 2. Tdap vaccine Tdap is only for children 7 years and older, adolescents, and adults.  Adolescents should receive a single dose of Tdap, preferably at age 53 or 35 years. Pregnant women should get a dose of Tdap during every pregnancy, to protect the newborn from pertussis. Infants are most at risk for severe, life-threatening complications from pertussis. Adults who have never received Tdap should get a dose of Tdap. Also, adults should receive a booster dose every 10 years, or earlier in the case of a severe and dirty wound or burn. Booster doses can be either Tdap or Td (a different vaccine that protects against tetanus and diphtheria but not pertussis). Tdap may be given at the same time as other vaccines. 3. Talk with your health care provider Tell your vaccine provider if the person getting the vaccine:  Has had an allergic reaction after a previous dose of any vaccine that protects against tetanus, diphtheria, or pertussis,  or has any severe, life-threatening allergies.  Has had a coma, decreased level of consciousness, or prolonged seizures within 7 days after a previous dose of any pertussis vaccine (DTP, DTaP, or Tdap).  Has seizures or another nervous system problem.  Has ever had Guillain-Barr Syndrome (also called GBS).  Has had severe pain or swelling after a previous dose of any vaccine that protects against tetanus or diphtheria. In some cases, your health care provider may decide to postpone Tdap vaccination to a future visit.  People with minor illnesses, such as a cold, may be vaccinated. People who are moderately or severely ill should usually wait until they recover before getting Tdap vaccine.  Your health care provider can give you more information. 4. Risks of a vaccine reaction  Pain, redness, or swelling where the shot was given, mild fever, headache, feeling tired, and nausea, vomiting, diarrhea, or stomachache sometimes happen after Tdap vaccine. People sometimes faint after medical procedures, including vaccination. Tell your provider if you feel dizzy or have vision changes or ringing in the ears.  As with any medicine, there is a very remote chance of a vaccine causing a severe allergic reaction, other serious injury, or death. 5. What if there is a serious problem? An allergic reaction could occur after the vaccinated person leaves the clinic. If you see signs of a severe allergic reaction (hives, swelling of the face and throat, difficulty breathing, a fast heartbeat, dizziness, or weakness), call 9-1-1 and get the person to the nearest hospital. For other signs that concern you, call your health care provider.  Adverse reactions should be reported to the Vaccine Adverse Event Reporting System (VAERS). Your health care provider will usually file this report,  or you can do it yourself. Visit the VAERS website at www.vaers.SamedayNews.es or call 520-150-1393. VAERS is only for reporting  reactions, and VAERS staff do not give medical advice. 6. The National Vaccine Injury Compensation Program The [employer] Vaccine Injury Compensation Program (VICP) is a federal program that was created to compensate people who may have been injured by certain vaccines. Visit the VICP website at GoldCloset.com.ee or call 785 702 6827 to learn about the program and about filing a claim. There is a time limit to file a claim for compensation. 7. How can I learn more?  Ask your health care provider.  Call your local or state health department.  Contact the Centers for Disease Control and Prevention (CDC): ? Call 561-015-5436 (1-800-CDC-INFO) or ? Visit CDC's website at http://hunter.com/ Vaccine Information Statement Tdap (Tetanus, Diphtheria, Pertussis) Vaccine (01/07/2019) This information is not intended to replace advice given to you by your health care provider. Make sure you discuss any questions you have with your health care provider. Document Revised: 01/16/2019 Document Reviewed: 01/19/2019 Elsevier Patient Education  [place]. Common Medications Safe in Pregnancy  Acne:      Constipation:  Benzoyl Peroxide     Colace  Clindamycin      Dulcolax Suppository  Topica Erythromycin     Fibercon  Salicylic Acid      Metamucil         Miralax AVOID:        Senakot   Accutane    Cough:  Retin-A       Cough Drops  Tetracycline      Phenergan w/ Codeine if Rx  Minocycline      Robitussin (Plain & DM)  Antibiotics:     Crabs/Lice:  Ceclor       RID  Cephalosporins    AVOID:  E-Mycins      Kwell  Keflex  Macrobid/Macrodantin   Diarrhea:  Penicillin      Kao-Pectate  Zithromax      Imodium AD         PUSH FLUIDS AVOID:       Cipro     Fever:  Tetracycline      Tylenol (Regular or Extra  Minocycline       Strength)  Levaquin      Extra Strength-Do not          Exceed 8 tabs/24 hrs Caffeine:        <259m/day (equiv. To 1 cup of coffee or  approx. 3  12 oz sodas)         Gas: Cold/Hayfever:       Gas-X  Benadryl      Mylicon  Claritin       Phazyme  **Claritin-D        Chlor-Trimeton    Headaches:  Dimetapp      ASA-Free Excedrin  Drixoral-Non-Drowsy     Cold Compress  Mucinex (Guaifenasin)     Tylenol (Regular or Extra  Sudafed/Sudafed-12 Hour     Strength)  **Sudafed PE Pseudoephedrine   Tylenol Cold & Sinus     Vicks Vapor Rub  Zyrtec  **AVOID if Problems With Blood Pressure         Heartburn: Avoid lying down for at least 1 hour after meals  Aciphex      Maalox     Rash:  Milk of Magnesia     Benadryl    Mylanta       1% Hydrocortisone Cream  Pepcid  Pepcid  Complete   Sleep Aids:  Prevacid      Ambien   Prilosec       Benadryl  Rolaids       Chamomile Tea  Tums (Limit 4/day)     Unisom  Zantac       Tylenol PM         Warm milk-add vanilla or  Hemorrhoids:       Sugar for taste  Anusol/Anusol H.C.  (RX: Analapram 2.5%)  Sugar Substitutes:  Hydrocortisone OTC     Ok in moderation  Preparation H      Tucks        Vaseline lotion applied to tissue with wiping    Herpes:     Throat:  Acyclovir      Oragel  Famvir  Valtrex     Vaccines:         Flu Shot Leg Cramps:       *Gardasil  Benadryl      Hepatitis A         Hepatitis B Nasal Spray:       Pneumovax  Saline Nasal Spray     Polio Booster         Tetanus Nausea:       Tuberculosis test or PPD  Vitamin B6 25 mg TID   AVOID:    Dramamine      *Gardasil  Emetrol       Live Poliovirus  Ginger Root 250 mg QID    MMR (measles, mumps &  High Complex Carbs @ Bedtime    rebella)  Sea Bands-Accupressure    Varicella (Chickenpox)  Unisom 1/2 tab TID     *No known complications           If received before Pain:         Known pregnancy;   Darvocet       Resume series after  Lortab        Delivery  Percocet    Yeast:   Tramadol      Femstat  Tylenol 3      Gyne-lotrimin  Ultram       Monistat  Vicodin           MISC:         All  Sunscreens           Hair Coloring/highlights          Insect Repellant's          (Including DEET)         Mystic Tans Breastfeeding  Choosing to breastfeed is one of the best decisions you can make for yourself and your baby. A change in hormones during pregnancy causes your breasts to make breast milk in your milk-producing glands. Hormones prevent breast milk from being released before your baby is born. They also prompt milk flow after birth. Once breastfeeding has begun, thoughts of your baby, as well as his or her sucking or crying, can stimulate the release of milk from your milk-producing glands. Benefits of breastfeeding Research shows that breastfeeding offers many health benefits for infants and mothers. It also offers a cost-free and convenient way to feed your baby. For your baby  Your first milk (colostrum) helps your baby's digestive system to function better.  Special cells in your milk (antibodies) help your baby to fight off infections.  Breastfed babies are less likely to develop asthma, allergies, obesity, or type 2 diabetes. They are also at lower risk  for sudden infant death syndrome (SIDS).  Nutrients in breast milk are better able to meet your baby's needs compared to infant formula.  Breast milk improves your baby's brain development. For you  Breastfeeding helps to create a very special bond between you and your baby.  Breastfeeding is convenient. Breast milk costs nothing and is always available at the correct temperature.  Breastfeeding helps to burn calories. It helps you to lose the weight that you gained during pregnancy.  Breastfeeding makes your uterus return faster to its size before pregnancy. It also slows bleeding (lochia) after you give birth.  Breastfeeding helps to lower your risk of developing type 2 diabetes, osteoporosis, rheumatoid arthritis, cardiovascular disease, and breast, ovarian, uterine, and endometrial cancer later in  life. Breastfeeding basics Starting breastfeeding  Find a comfortable place to sit or lie down, with your neck and back well-supported.  Place a pillow or a rolled-up blanket under your baby to bring him or her to the level of your breast (if you are seated). Nursing pillows are specially designed to help support your arms and your baby while you breastfeed.  Make sure that your baby's tummy (abdomen) is facing your abdomen.  Gently massage your breast. With your fingertips, massage from the outer edges of your breast inward toward the nipple. This encourages milk flow. If your milk flows slowly, you may need to continue this action during the feeding.  Support your breast with 4 fingers underneath and your thumb above your nipple (make the letter "C" with your hand). Make sure your fingers are well away from your nipple and your baby's mouth.  Stroke your baby's lips gently with your finger or nipple.  When your baby's mouth is open wide enough, quickly bring your baby to your breast, placing your entire nipple and as much of the areola as possible into your baby's mouth. The areola is the colored area around your nipple. ? More areola should be visible above your baby's upper lip than below the lower lip. ? Your baby's lips should be opened and extended outward (flanged) to ensure an adequate, comfortable latch. ? Your baby's tongue should be between his or her lower gum and your breast.  Make sure that your baby's mouth is correctly positioned around your nipple (latched). Your baby's lips should create a seal on your breast and be turned out (everted).  It is common for your baby to suck about 2-3 minutes in order to start the flow of breast milk. Latching Teaching your baby how to latch onto your breast properly is very important. An improper latch can cause nipple pain, decreased milk supply, and poor weight gain in your baby. Also, if your baby is not latched onto your nipple  properly, he or she may swallow some air during feeding. This can make your baby fussy. Burping your baby when you switch breasts during the feeding can help to get rid of the air. However, teaching your baby to latch on properly is still the best way to prevent fussiness from swallowing air while breastfeeding. Signs that your baby has successfully latched onto your nipple  Silent tugging or silent sucking, without causing you pain. Infant's lips should be extended outward (flanged).  Swallowing heard between every 3-4 sucks once your milk has started to flow (after your let-down milk reflex occurs).  Muscle movement above and in front of his or her ears while sucking. Signs that your baby has not successfully latched onto your nipple  Sucking sounds  or smacking sounds from your baby while breastfeeding.  Nipple pain. If you think your baby has not latched on correctly, slip your finger into the corner of your baby's mouth to break the suction and place it between your baby's gums. Attempt to start breastfeeding again. Signs of successful breastfeeding Signs from your baby  Your baby will gradually decrease the number of sucks or will completely stop sucking.  Your baby will fall asleep.  Your baby's body will relax.  Your baby will retain a small amount of milk in his or her mouth.  Your baby will let go of your breast by himself or herself. Signs from you  Breasts that have increased in firmness, weight, and size 1-3 hours after feeding.  Breasts that are softer immediately after breastfeeding.  Increased milk volume, as well as a change in milk consistency and color by the fifth day of breastfeeding.  Nipples that are not sore, cracked, or bleeding. Signs that your baby is getting enough milk  Wetting at least 1-2 diapers during the first 24 hours after birth.  Wetting at least 5-6 diapers every 24 hours for the first week after birth. The urine should be clear or pale  yellow by the age of 5 days.  Wetting 6-8 diapers every 24 hours as your baby continues to grow and develop.  At least 3 stools in a 24-hour period by the age of 5 days. The stool should be soft and yellow.  At least 3 stools in a 24-hour period by the age of 7 days. The stool should be seedy and yellow.  No loss of weight greater than 10% of birth weight during the first 3 days of life.  Average weight gain of 4-7 oz (113-198 g) per week after the age of 4 days.  Consistent daily weight gain by the age of 5 days, without weight loss after the age of 2 weeks. After a feeding, your baby may spit up a small amount of milk. This is normal. Breastfeeding frequency and duration Frequent feeding will help you make more milk and can prevent sore nipples and extremely full breasts (breast engorgement). Breastfeed when you feel the need to reduce the fullness of your breasts or when your baby shows signs of hunger. This is called "breastfeeding on demand." Signs that your baby is hungry include:  Increased alertness, activity, or restlessness.  Movement of the head from side to side.  Opening of the mouth when the corner of the mouth or cheek is stroked (rooting).  Increased sucking sounds, smacking lips, cooing, sighing, or squeaking.  Hand-to-mouth movements and sucking on fingers or hands.  Fussing or crying. Avoid introducing a pacifier to your baby in the first 4-6 weeks after your baby is born. After this time, you may choose to use a pacifier. Research has shown that pacifier use during the first year of a baby's life decreases the risk of sudden infant death syndrome (SIDS). Allow your baby to feed on each breast as long as he or she wants. When your baby unlatches or falls asleep while feeding from the first breast, offer the second breast. Because newborns are often sleepy in the first few weeks of life, you may need to awaken your baby to get him or her to feed. Breastfeeding times  will vary from baby to baby. However, the following rules can serve as a guide to help you make sure that your baby is properly fed:  Newborns (babies 25 weeks of age  or younger) may breastfeed every 1-3 hours.  Newborns should not go without breastfeeding for longer than 3 hours during the day or 5 hours during the night.  You should breastfeed your baby a minimum of 8 times in a 24-hour period. Breast milk pumping     Pumping and storing breast milk allows you to make sure that your baby is exclusively fed your breast milk, even at times when you are unable to breastfeed. This is especially important if you go back to work while you are still breastfeeding, or if you are not able to be present during feedings. Your lactation consultant can help you find a method of pumping that works best for you and give you guidelines about how long it is safe to store breast milk. Caring for your breasts while you breastfeed Nipples can become dry, cracked, and sore while breastfeeding. The following recommendations can help keep your breasts moisturized and healthy:  Avoid using soap on your nipples.  Wear a supportive bra designed especially for nursing. Avoid wearing underwire-style bras or extremely tight bras (sports bras).  Air-dry your nipples for 3-4 minutes after each feeding.  Use only cotton bra pads to absorb leaked breast milk. Leaking of breast milk between feedings is normal.  Use lanolin on your nipples after breastfeeding. Lanolin helps to maintain your skin's normal moisture barrier. Pure lanolin is not harmful (not toxic) to your baby. You may also hand express a few drops of breast milk and gently massage that milk into your nipples and allow the milk to air-dry. In the first few weeks after giving birth, some women experience breast engorgement. Engorgement can make your breasts feel heavy, warm, and tender to the touch. Engorgement peaks within 3-5 days after you give birth. The  following recommendations can help to ease engorgement:  Completely empty your breasts while breastfeeding or pumping. You may want to start by applying warm, moist heat (in the shower or with warm, water-soaked hand towels) just before feeding or pumping. This increases circulation and helps the milk flow. If your baby does not completely empty your breasts while breastfeeding, pump any extra milk after he or she is finished.  Apply ice packs to your breasts immediately after breastfeeding or pumping, unless this is too uncomfortable for you. To do this: ? Put ice in a plastic bag. ? Place a towel between your skin and the bag. ? Leave the ice on for 20 minutes, 2-3 times a day.  Make sure that your baby is latched on and positioned properly while breastfeeding. If engorgement persists after 48 hours of following these recommendations, contact your health care provider or a Science writer. Overall health care recommendations while breastfeeding  Eat 3 healthy meals and 3 snacks every day. Well-nourished mothers who are breastfeeding need an additional 450-500 calories a day. You can meet this requirement by increasing the amount of a balanced diet that you eat.  Drink enough water to keep your urine pale yellow or clear.  Rest often, relax, and continue to take your prenatal vitamins to prevent fatigue, stress, and low vitamin and mineral levels in your body (nutrient deficiencies).  Do not use any products that contain nicotine or tobacco, such as cigarettes and e-cigarettes. Your baby may be harmed by chemicals from cigarettes that pass into breast milk and exposure to secondhand smoke. If you need help quitting, ask your health care provider.  Avoid alcohol.  Do not use illegal drugs or marijuana.  Talk with your  health care provider before taking any medicines. These include over-the-counter and prescription medicines as well as vitamins and herbal supplements. Some medicines that  may be harmful to your baby can pass through breast milk.  It is possible to become pregnant while breastfeeding. If birth control is desired, ask your health care provider about options that will be safe while breastfeeding your baby. Where to find more information: [employer]: www.llli.org Contact a health care provider if:  You feel like you want to stop breastfeeding or have become frustrated with breastfeeding.  Your nipples are cracked or bleeding.  Your breasts are red, tender, or warm.  You have: ? Painful breasts or nipples. ? A swollen area on either breast. ? A fever or chills. ? Nausea or vomiting. ? Drainage other than breast milk from your nipples.  Your breasts do not become full before feedings by the fifth day after you give birth.  You feel sad and depressed.  Your baby is: ? Too sleepy to eat well. ? Having trouble sleeping. ? More than 80 week old and wetting fewer than 6 diapers in a 24-hour period. ? Not gaining weight by 38 days of age.  Your baby has fewer than 3 stools in a 24-hour period.  Your baby's skin or the white parts of his or her eyes become yellow. Get help right away if:  Your baby is overly tired (lethargic) and does not want to wake up and feed.  Your baby develops an unexplained fever. Summary  Breastfeeding offers many health benefits for infant and mothers.  Try to breastfeed your infant when he or she shows early signs of hunger.  Gently tickle or stroke your baby's lips with your finger or nipple to allow the baby to open his or her mouth. Bring the baby to your breast. Make sure that much of the areola is in your baby's mouth. Offer one side and burp the baby before you offer the other side.  Talk with your health care provider or lactation consultant if you have questions or you face problems as you breastfeed. This information is not intended to replace advice given to you by your health care provider. Make  sure you discuss any questions you have with your health care provider. Document Revised: 12/19/2017 Document Reviewed: 10/26/2016 Elsevier Patient Education  [facility]  The third trimester is from week 28 through week 40 (months 7 through 9). This trimester is when your unborn baby (fetus) is growing very fast. At the end of the ninth month, the unborn baby is about 20 inches in length. It weighs about 6-10 pounds. Follow these instructions at home: Medicines  Take over-the-counter and prescription medicines only as told by your doctor. Some medicines are safe and some medicines are not safe during pregnancy.  Take a prenatal vitamin that contains at least 600 micrograms (mcg) of folic acid.  If you have trouble pooping (constipation), take medicine that will make your stool soft (stool softener) if your doctor approves. Eating and drinking   Eat regular, healthy meals.  Avoid raw meat and uncooked cheese.  If you get low calcium from the food you eat, talk to your doctor about taking a daily calcium supplement.  Eat four or five small meals rather than three large meals a day.  Avoid foods that are high in fat and sugars, such as fried and sweet foods.  To prevent constipation: ? Eat foods that are  high in fiber, like fresh fruits and vegetables, whole grains, and beans. ? Drink enough fluids to keep your pee (urine) clear or pale yellow. Activity  Exercise only as told by your doctor. Stop exercising if you start to have cramps.  Avoid heavy lifting, wear low heels, and sit up straight.  Do not exercise if it is too hot, too humid, or if you are in a place of great height (high altitude).  You may continue to have sex unless your doctor tells you not to. Relieving pain and discomfort  Wear a good support bra if your breasts are tender.  Take frequent breaks and rest with your legs raised if you have leg cramps or low back  pain.  Take warm water baths (sitz baths) to soothe pain or discomfort caused by hemorrhoids. Use hemorrhoid cream if your doctor approves.  If you develop puffy, bulging veins (varicose veins) in your legs: ? Wear support hose or compression stockings as told by your doctor. ? Raise (elevate) your feet for 15 minutes, 3-4 times a day. ? Limit salt in your food. Safety  Wear your seat belt when driving.  Make a list of emergency phone numbers, including numbers for family, friends, the hospital, and police and fire departments. Preparing for your baby's arrival To prepare for the arrival of your baby:  Take prenatal classes.  Practice driving to the hospital.  Visit the hospital and tour the maternity area.  Talk to your work about taking leave once the baby comes.  Pack your hospital bag.  Prepare the baby's room.  Go to your doctor visits.  Buy a rear-facing car seat. Learn how to install it in your car. General instructions  Do not use hot tubs, steam rooms, or saunas.  Do not use any products that contain nicotine or tobacco, such as cigarettes and e-cigarettes. If you need help quitting, ask your doctor.  Do not drink alcohol.  Do not douche or use tampons or scented sanitary pads.  Do not cross your legs for long periods of time.  Do not travel for long distances unless you must. Only do so if your doctor says it is okay.  Visit your dentist if you have not gone during your pregnancy. Use a soft toothbrush to brush your teeth. Be gentle when you floss.  Avoid cat litter boxes and soil used by cats. These carry germs that can cause birth defects in the baby and can cause a loss of your baby (miscarriage) or stillbirth.  Keep all your prenatal visits as told by your doctor. This is important. Contact a doctor if:  You are not sure if you are in labor or if your water has broken.  You are dizzy.  You have mild cramps or pressure in your lower belly.  You  have a nagging pain in your belly area.  You continue to feel sick to your stomach, you throw up, or you have watery poop.  You have bad smelling fluid coming from your vagina.  You have pain when you pee. Get help right away if:  You have a fever.  You are leaking fluid from your vagina.  You are spotting or bleeding from your vagina.  You have severe belly cramps or pain.  You lose or gain weight quickly.  You have trouble catching your breath and have chest pain.  You notice sudden or extreme puffiness (swelling) of your face, hands, ankles, feet, or legs.  You have not felt the  baby move in over an hour.  You have severe headaches that do not go away with medicine.  You have trouble seeing.  You are leaking, or you are having a gush of fluid, from your vagina before you are 37 weeks.  You have regular belly spasms (contractions) before you are 37 weeks. Summary  The third trimester is from week 28 through week 40 (months 7 through 9). This time is when your unborn baby is growing very fast.  Follow your doctor's advice about medicine, food, and activity.  Get ready for the arrival of your baby by taking prenatal classes, getting all the baby items ready, preparing the baby's room, and visiting your doctor to be checked.  Get help right away if you are bleeding from your vagina, or you have chest pain and trouble catching your breath, or if you have not felt your baby move in over an hour. This information is not intended to replace advice given to you by your health care provider. Make sure you discuss any questions you have with your health care provider. Document Revised: 01/15/2019 Document Reviewed: 10/30/2016 Elsevier Patient Education  [employer].

## 2020-03-03 LAB — CBC
Hematocrit: 36.5 % (ref 34.0–46.6)
Hemoglobin: 11.6 g/dL (ref 11.1–15.9)
MCH: 27.1 pg (ref 26.6–33.0)
MCHC: 31.8 g/dL (ref 31.5–35.7)
MCV: 85 fL (ref 79–97)
Platelets: 283 10*3/uL (ref 150–450)
RBC: 4.28 x10E6/uL (ref 3.77–5.28)
RDW: 14 % (ref 11.7–15.4)
WBC: 10.9 10*3/uL — ABNORMAL HIGH (ref 3.4–10.8)

## 2020-03-03 LAB — RPR: RPR Ser Ql: NONREACTIVE

## 2020-03-03 LAB — GLUCOSE, 1 HOUR GESTATIONAL: Gestational Diabetes Screen: 50 mg/dL — ABNORMAL LOW (ref 65–139)

## 2020-03-15 ENCOUNTER — Other Ambulatory Visit: Payer: Self-pay

## 2020-03-15 ENCOUNTER — Telehealth: Payer: Self-pay | Admitting: Obstetrics and Gynecology

## 2020-03-15 ENCOUNTER — Emergency Department: Payer: Medicaid Other

## 2020-03-15 DIAGNOSIS — Y939 Activity, unspecified: Secondary | ICD-10-CM | POA: Diagnosis not present

## 2020-03-15 DIAGNOSIS — S70362A Insect bite (nonvenomous), left thigh, initial encounter: Secondary | ICD-10-CM | POA: Insufficient documentation

## 2020-03-15 DIAGNOSIS — Y999 Unspecified external cause status: Secondary | ICD-10-CM | POA: Insufficient documentation

## 2020-03-15 DIAGNOSIS — M79605 Pain in left leg: Secondary | ICD-10-CM | POA: Insufficient documentation

## 2020-03-15 DIAGNOSIS — L03116 Cellulitis of left lower limb: Secondary | ICD-10-CM | POA: Insufficient documentation

## 2020-03-15 DIAGNOSIS — Y929 Unspecified place or not applicable: Secondary | ICD-10-CM | POA: Diagnosis not present

## 2020-03-15 DIAGNOSIS — W57XXXA Bitten or stung by nonvenomous insect and other nonvenomous arthropods, initial encounter: Secondary | ICD-10-CM | POA: Insufficient documentation

## 2020-03-15 DIAGNOSIS — Z79899 Other long term (current) drug therapy: Secondary | ICD-10-CM | POA: Diagnosis not present

## 2020-03-15 NOTE — Telephone Encounter (Signed)
Pt called in she had a death in the family and the funeral is tomorrow. The pt stated that she can come in any day after tomorrow. I saw Dr. Oretha Milch schedule is booked I wanted to ask before I put her on. Please advise

## 2020-03-15 NOTE — ED Triage Notes (Addendum)
Pt arrives to ED via POV from home with c/o insect bite to the posterior aspect of the left knee. Pt denies seeing what bit/stung her; states she woke up to pain and swelling. No c/o CP or SHOB. Pt is A&O, in NAD; RR even, regular, and unlabored.

## 2020-03-16 ENCOUNTER — Emergency Department
Admission: EM | Admit: 2020-03-16 | Discharge: 2020-03-16 | Disposition: A | Discharge status: Home / Self Care | Payer: Medicaid Other | Attending: Emergency Medicine | Admitting: Emergency Medicine

## 2020-03-16 ENCOUNTER — Encounter: Payer: Medicaid Other | Admitting: Obstetrics and Gynecology

## 2020-03-16 DIAGNOSIS — L03116 Cellulitis of left lower limb: Secondary | ICD-10-CM

## 2020-03-16 DIAGNOSIS — W57XXXA Bitten or stung by nonvenomous insect and other nonvenomous arthropods, initial encounter: Secondary | ICD-10-CM

## 2020-03-16 MED ORDER — CEPHALEXIN 500 MG PO CAPS
500.0000 mg | ORAL_CAPSULE | Freq: Three times a day (TID) | ORAL | 0 refills | Status: AC
Start: 2020-03-16 — End: 2020-03-23

## 2020-03-16 MED ORDER — CEPHALEXIN 500 MG PO CAPS
500.0000 mg | ORAL_CAPSULE | Freq: Once | ORAL | Status: AC
  Administered 2020-03-16: 500 mg via ORAL
  Filled 2020-03-16: qty 1

## 2020-03-16 MED ORDER — ACETAMINOPHEN 500 MG PO TABS
1000.0000 mg | ORAL_TABLET | Freq: Once | ORAL | Status: AC
  Administered 2020-03-16: 1000 mg via ORAL
  Filled 2020-03-16: qty 2

## 2020-03-16 NOTE — ED Notes (Addendum)
Pt has a insect bite to back of left leg.  Pt has itching and redness.  Pt tried alcohol and abx ointment without relief.  Pt is pregnant.  Pt alert  Speech clear. Pt in hallway bed.

## 2020-03-16 NOTE — Discharge Instructions (Signed)

## 2020-03-16 NOTE — ED Provider Notes (Signed)
[facility] Emergency Department Provider Note  ____________________________________________  Time seen: Approximately 1:38 AM  I have reviewed the triage vital signs and the nursing notes.   HISTORY  Chief Complaint Insect Bite   HPI Deborah Strong is a 24 y.o. female who presents for evaluation of insect bite.  Patient reports that she was bitten by an insect earlier today behind her left thigh.  It was very itchy and she was scratching throughout the day.  Started to become more painful, swollen and red this evening.  No fever or chills.  She is not a diabetic.  Patient is currently 7 months pregnant.   Past Medical History:  Diagnosis Date  . Medical history non-contributory   . Patient denies medical problems     Patient Active Problem List   Diagnosis Date Noted  . Preterm uterine contractions in second trimester, antepartum 02/15/2020  . Alpha thalassemia silent carrier 12/03/2019  . Trichomonal vaginitis during pregnancy 11/06/2019    Past Surgical History:  Procedure Laterality Date  . NO PAST SURGERIES    . no surical history      Prior to Admission medications   Medication Sig Start Date End Date Taking? Authorizing Provider  cephALEXin (KEFLEX) 500 MG capsule Take 1 capsule (500 mg total) by mouth 3 (three) times daily for 7 days. 03/16/20 03/23/20  Nita Sickle, MD  hydrOXYzine (VISTARIL) 25 MG capsule Take 1 capsule (25 mg total) by mouth in the morning, at noon, and at bedtime. 02/16/20   Hildred Laser, MD  NIFEdipine (PROCARDIA) 10 MG capsule Take 1 capsule (10 mg total) by mouth 3 (three) times daily. 02/15/20   Hildred Laser, MD  Prenatal Vit-Fe Fumarate-FA (MULTIVITAMIN-PRENATAL) 27-0.8 MG TABS tablet Take by mouth daily at 12 noon.    [provider]    Allergies Patient has no known allergies.  Family History  Problem Relation Age of Onset  . Healthy Mother   . Healthy Father     Social History Social  History   Tobacco Use  . Smoking status: Never Smoker  . Smokeless tobacco: Never Used  Substance Use Topics  . Alcohol use: Not Currently    Comment: Last use 2 months ago  . Drug use: Never    Review of Systems  Constitutional: Negative for fever. Eyes: Negative for visual changes. ENT: Negative for sore throat. Neck: No neck pain  Cardiovascular: Negative for chest pain. Respiratory: Negative for shortness of breath. Gastrointestinal: Negative for abdominal pain, vomiting or diarrhea. Genitourinary: Negative for dysuria. Musculoskeletal: Negative for back pain. Skin: Negative for rash. + insect bite Neurological: Negative for headaches, weakness or numbness. Psych: No SI or HI  ____________________________________________   PHYSICAL EXAM:  VITAL SIGNS: ED Triage Vitals  Enc Vitals Group     BP 03/15/20 2129 114/72     Pulse Rate 03/15/20 2129 87     Resp 03/15/20 2129 16     Temp 03/15/20 2129 98.7 F (37.1 C)     Temp Source 03/15/20 2129 Oral     SpO2 03/15/20 2129 98 %     Weight 03/15/20 2128 214 lb (97.1 kg)     Height 03/15/20 2128 5\' 7"  (1.702 m)     Head Circumference --      Peak Flow --      Pain Score 03/15/20 2128 10     Pain Loc --      Pain Edu? --      Excl. in  [place]? --     Constitutional: Alert and oriented. Well appearing and in no apparent distress. HEENT:      Head: Normocephalic and atraumatic.         Eyes: Conjunctivae are normal. Sclera is non-icteric.       Mouth/Throat: Mucous membranes are moist.       Neck: Supple with no signs of meningismus. Cardiovascular: Regular rate and rhythm.  Respiratory: Normal respiratory effort.  Gastrointestinal: Gravid Musculoskeletal: There is an insect bite located on the posterior aspect of the left thigh with surrounding induration, erythema, and warmth. Neurologic: Normal speech and language. Face is symmetric. Moving all extremities. No gross focal neurologic deficits are appreciated. Skin:  Skin is warm, dry and intact. No rash noted. Psychiatric: Mood and affect are normal. Speech and behavior are normal.  ____________________________________________   LABS (all labs ordered are listed, but only abnormal results are displayed)  Labs Reviewed - No data to display ____________________________________________  EKG  none  ____________________________________________  RADIOLOGY  I have personally reviewed the images performed during this visit and I agree with the Radiologist's read.   Interpretation by Radiologist:  US Venous Img Lower Unilateral Left  Result Date: 03/15/2020 CLINICAL DATA:  Left lower extremity pain and swelling tonight, possible insect bite. Pregnant patient. EXAM: LEFT LOWER EXTREMITY VENOUS DOPPLER ULTRASOUND TECHNIQUE: Gray-scale sonography with compression, as well as color and duplex ultrasound, were performed to evaluate the deep venous system(s) from the level of the common femoral vein through the popliteal and proximal calf veins. COMPARISON:  None. FINDINGS: VENOUS Normal compressibility of the common femoral, superficial femoral, and popliteal veins, as well as the visualized calf veins. Visualized portions of profunda femoral vein and great saphenous vein unremarkable. No filling defects to suggest DVT on grayscale or color Doppler imaging. Doppler waveforms show normal direction of venous flow, normal respiratory plasticity and response to augmentation. Limited views of the contralateral common femoral vein are unremarkable. OTHER Targeted sonographic evaluation in the area of redness and swelling behind knee demonstrates no focal sonographic abnormality. Limitations: none IMPRESSION: No evidence of left lower extremity DVT. Electronically Signed   By: Narda Rutherford M.D.   On: 03/15/2020 23:58    ____________________________________________   PROCEDURES  Procedure(s) performed: None Procedures Critical Care performed:   None ____________________________________________   INITIAL IMPRESSION / ASSESSMENT AND PLAN / ED COURSE  24 y.o. female who presents for evaluation of insect bite.  Patient has an insect bite located in posterior aspect of the left thigh with surrounding induration, erythema and warmth.  Bedside ultrasound showing no fluctuance.  Possibly irritation from an insect bite versus early cellulitis.  Since patient is pregnant we will treat with Keflex.  Area was demarcated and patient was counseled to return to the emergency room if redness is spreading, if pain is getting worse, if she notices any pus, fever, chills, nausea or vomiting.  Otherwise she will follow-up with her doctor in 2 days for reevaluation.  Old medical records reviewed.  Doppler ultrasound of the left lower extremity was done in triage and reviewed by me with no evidence of DVT, confirmed by radiology.      _____________________________________________ Please note:  Patient was evaluated in Emergency Department today for the symptoms described in the history of present illness. Patient was evaluated in the context of the global COVID-19 pandemic, which necessitated consideration that the patient might be at risk for infection with the SARS-CoV-2 virus that causes COVID-19. Institutional protocols and algorithms that  pertain to the evaluation of patients at risk for COVID-19 are in a state of rapid change based on information released by regulatory bodies including the CDC and federal and state organizations. These policies and algorithms were followed during the patient's care in the ED.  Some ED evaluations and interventions may be delayed as a result of limited staffing during the pandemic.   [place] Controlled Substance Database was reviewed by me. ____________________________________________   FINAL CLINICAL IMPRESSION(S) / ED DIAGNOSES   Final diagnoses:  Insect bite of left thigh, initial encounter  Cellulitis of left lower  extremity      NEW MEDICATIONS STARTED DURING THIS VISIT:  ED Discharge Orders         Ordered    cephALEXin (KEFLEX) 500 MG capsule  3 times daily     03/16/20 0137           Note:  This document was prepared using Dragon voice recognition software and may include unintentional dictation errors.    Alfred Levins, [place], MD 03/16/20 [phone]

## 2020-03-16 NOTE — Progress Notes (Deleted)
ROB-Pt present for routine prenatal care 

## 2020-03-17 ENCOUNTER — Emergency Department
Admission: EM | Admit: 2020-03-17 | Discharge: 2020-03-17 | Disposition: A | Discharge status: Home / Self Care | Payer: Medicaid Other | Attending: Emergency Medicine | Admitting: Emergency Medicine

## 2020-03-17 ENCOUNTER — Other Ambulatory Visit: Payer: Self-pay

## 2020-03-17 DIAGNOSIS — L03116 Cellulitis of left lower limb: Secondary | ICD-10-CM | POA: Insufficient documentation

## 2020-03-17 DIAGNOSIS — Y929 Unspecified place or not applicable: Secondary | ICD-10-CM | POA: Insufficient documentation

## 2020-03-17 DIAGNOSIS — Y939 Activity, unspecified: Secondary | ICD-10-CM | POA: Diagnosis not present

## 2020-03-17 DIAGNOSIS — Y999 Unspecified external cause status: Secondary | ICD-10-CM | POA: Insufficient documentation

## 2020-03-17 DIAGNOSIS — S70362A Insect bite (nonvenomous), left thigh, initial encounter: Secondary | ICD-10-CM | POA: Diagnosis not present

## 2020-03-17 DIAGNOSIS — L089 Local infection of the skin and subcutaneous tissue, unspecified: Secondary | ICD-10-CM

## 2020-03-17 DIAGNOSIS — Z79899 Other long term (current) drug therapy: Secondary | ICD-10-CM | POA: Insufficient documentation

## 2020-03-17 DIAGNOSIS — W57XXXA Bitten or stung by nonvenomous insect and other nonvenomous arthropods, initial encounter: Secondary | ICD-10-CM | POA: Insufficient documentation

## 2020-03-17 MED ORDER — CEPHALEXIN 500 MG PO CAPS
500.0000 mg | ORAL_CAPSULE | Freq: Once | ORAL | Status: AC
  Administered 2020-03-17: 500 mg via ORAL
  Filled 2020-03-17: qty 1

## 2020-03-17 NOTE — ED Provider Notes (Signed)
[facility] Emergency Department Provider Note ____________________________________________  Time seen: 1930  I have reviewed the triage vital signs and the nursing notes.  HISTORY  Chief Complaint  Insect Bite   HPI Deborah Strong is a 24 y.o. female presents to the ER today with complaint of insect bite to left posterior thigh.  She reports she was evaluated for this yesterday.  Ultrasound did not show any abscess formation.  She was given a dose of Keflex and discharged with oral Keflex for an additional 10 days.  She reports that she never picked up the antibiotics.  She returns for evaluation today due to new insect bite to her left posterior thigh.  She reports some tenderness and warmth but denies overt pain or drainage.  She denies fever, chills or body aches.  She has not tried any medications OTC for this.  Past Medical History:  Diagnosis Date  . Medical history non-contributory   . Patient denies medical problems     Patient Active Problem List   Diagnosis Date Noted  . Preterm uterine contractions in second trimester, antepartum 02/15/2020  . Alpha thalassemia silent carrier 12/03/2019  . Trichomonal vaginitis during pregnancy 11/06/2019    Past Surgical History:  Procedure Laterality Date  . NO PAST SURGERIES    . no surical history      Prior to Admission medications   Medication Sig Start Date End Date Taking? Authorizing Provider  cephALEXin (KEFLEX) 500 MG capsule Take 1 capsule (500 mg total) by mouth 3 (three) times daily for 7 days. 03/16/20 03/23/20  Nita Sickle, MD  hydrOXYzine (VISTARIL) 25 MG capsule Take 1 capsule (25 mg total) by mouth in the morning, at noon, and at bedtime. 02/16/20   Hildred Laser, MD  NIFEdipine (PROCARDIA) 10 MG capsule Take 1 capsule (10 mg total) by mouth 3 (three) times daily. 02/15/20   Hildred Laser, MD  Prenatal Vit-Fe Fumarate-FA (MULTIVITAMIN-PRENATAL) 27-0.8 MG TABS tablet Take by mouth daily  at 12 noon.    [provider]    Allergies Patient has no known allergies.  Family History  Problem Relation Age of Onset  . Healthy Mother   . Healthy Father     Social History Social History   Tobacco Use  . Smoking status: Never Smoker  . Smokeless tobacco: Never Used  Vaping Use  . Vaping Use: Never used  Substance Use Topics  . Alcohol use: Not Currently    Comment: Last use 2 months ago  . Drug use: Never    Review of Systems  Constitutional: Negative for fever, chills or body aches. Cardiovascular: Negative for chest pain or chest tightness. Respiratory: Negative for cough or shortness of breath. Skin: Positive for 2 insect bites to left posterior thigh. Neurological: Negative for headaches, focal weakness, tingling or numbness. ____________________________________________  PHYSICAL EXAM:  VITAL SIGNS: ED Triage Vitals  Enc Vitals Group     BP 03/17/20 1808 106/67     Pulse Rate 03/17/20 1808 92     Resp 03/17/20 1808 19     Temp 03/17/20 1808 98.2 F (36.8 C)     Temp Source 03/17/20 1808 Oral     SpO2 03/17/20 1808 99 %     Weight 03/17/20 1800 214 lb (97.1 kg)     Height 03/17/20 1800 5\' 7"  (1.702 m)     Head Circumference --      Peak Flow --      Pain Score 03/17/20 1800 4  Pain Loc --      Pain Edu? --      Excl. in [place]? --     Constitutional: Alert and oriented.  Pregnant: In no distress. Cardiovascular: Normal rate. Respiratory: Normal respiratory effort. No wheezes/rales/rhonchi. Neurologic:  Normal gait without ataxia. Normal speech and language. No gross focal neurologic deficits are appreciated. Skin:  7 x 5 cm area of cellulitis noted left posterior thigh, superior to the popliteal fossa. 11 x 7 cm area of cellulitis noted left posterior thigh, just inferior to the gluteus (this has been marked and redness has not extended outside this area). No obvious abscess. Area is non  fluctuant.  ______________________________________________  INITIAL IMPRESSION / ASSESSMENT AND PLAN / ED COURSE  Infected Insect Bite, Left Posterior Thigh:  No evidence of abscess, nothing to drain Keflex 500 mg PO x 1 Advised her to go pick up her RX for Keflex as previously prescribed and start taking as prescribed  ____________________________________________  FINAL CLINICAL IMPRESSION(S) / ED DIAGNOSES  Final diagnoses:  Infected insect bite of left thigh, subsequent encounter      Jearld Fenton, NP 03/17/20 1942    Nance Pear, MD 03/17/20 1942

## 2020-03-17 NOTE — ED Notes (Signed)
LDR called and provided with information per EDP Derrill Kay. Checking FHT at this time. Per MD Valentino Saxon she is ok with just completing FHT at this time

## 2020-03-17 NOTE — ED Notes (Signed)
Pt resp reg/unlabored. In with "bug bites" to L thigh. Swollen, warm, pink. Mild itching at "bites". Education on antibiotic given.

## 2020-03-17 NOTE — ED Triage Notes (Signed)
Pt comes via POV from home with c/o insect bite. Pt just seen here last night for same thing. Pt states she now has 3 bites.  Pt has redness and swelling noted to left upper thigh.  Pt states she is also [redacted] weeks pregnant and worried the insect bite might cause harm to baby. Pt states no movement of baby.

## 2020-03-17 NOTE — Discharge Instructions (Addendum)
You were seen today for 2 infected insect bites to your left posterior thigh.  These are not drainable.  We gave you a dose of antibiotics in the ER.  Please go pick up your prescription for antibiotics and take as directed.  Follow-up in the ER for increased redness, swelling, pain, fever, chills, nausea or vomiting.  Otherwise follow-up at [facility] clinic.

## 2020-03-18 ENCOUNTER — Telehealth: Payer: Self-pay | Admitting: Obstetrics and Gynecology

## 2020-03-18 ENCOUNTER — Encounter: Payer: Self-pay | Admitting: Obstetrics and Gynecology

## 2020-03-18 NOTE — Telephone Encounter (Signed)
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 Thanks [employer]

## 2020-03-18 NOTE — Telephone Encounter (Signed)
Called patient to inform her she missed her recent OB appointment. Phone numbers on file were either disconnected or was unsuccessful reaching patient. Sent missed ob appointment letter via mail.

## 2020-03-21 NOTE — Patient Instructions (Addendum)
Third Trimester of Pregnancy  The third trimester is from week 28 through week 40 (months 7 through 9). This trimester is when your unborn baby (fetus) is growing very fast. At the end of the ninth month, the unborn baby is about 20 inches in length. It weighs about 6-10 pounds. Follow these instructions at home: Medicines  Take over-the-counter and prescription medicines only as told by your doctor. Some medicines are safe and some medicines are not safe during pregnancy.  Take a prenatal vitamin that contains at least 600 micrograms (mcg) of folic acid.  If you have trouble pooping (constipation), take medicine that will make your stool soft (stool softener) if your doctor approves. Eating and drinking   Eat regular, healthy meals.  Avoid raw meat and uncooked cheese.  If you get low calcium from the food you eat, talk to your doctor about taking a daily calcium supplement.  Eat four or five small meals rather than three large meals a day.  Avoid foods that are high in fat and sugars, such as fried and sweet foods.  To prevent constipation: ? Eat foods that are high in fiber, like fresh fruits and vegetables, whole grains, and beans. ? Drink enough fluids to keep your pee (urine) clear or pale yellow. Activity  Exercise only as told by your doctor. Stop exercising if you start to have cramps.  Avoid heavy lifting, wear low heels, and sit up straight.  Do not exercise if it is too hot, too humid, or if you are in a place of great height (high altitude).  You may continue to have sex unless your doctor tells you not to. Relieving pain and discomfort  Wear a good support bra if your breasts are tender.  Take frequent breaks and rest with your legs raised if you have leg cramps or low back pain.  Take warm water baths (sitz baths) to soothe pain or discomfort caused by hemorrhoids. Use hemorrhoid cream if your doctor approves.  If you develop puffy, bulging veins (varicose  veins) in your legs: ? Wear support hose or compression stockings as told by your doctor. ? Raise (elevate) your feet for 15 minutes, 3-4 times a day. ? Limit salt in your food. Safety  Wear your seat belt when driving.  Make a list of emergency phone numbers, including numbers for family, friends, the hospital, and police and fire departments. Preparing for your baby's arrival To prepare for the arrival of your baby:  Take prenatal classes.  Practice driving to the hospital.  Visit the hospital and tour the maternity area.  Talk to your work about taking leave once the baby comes.  Pack your hospital bag.  Prepare the baby's room.  Go to your doctor visits.  Buy a rear-facing car seat. Learn how to install it in your car. General instructions  Do not use hot tubs, steam rooms, or saunas.  Do not use any products that contain nicotine or tobacco, such as cigarettes and e-cigarettes. If you need help quitting, ask your doctor.  Do not drink alcohol.  Do not douche or use tampons or scented sanitary pads.  Do not cross your legs for long periods of time.  Do not travel for long distances unless you must. Only do so if your doctor says it is okay.  Visit your dentist if you have not gone during your pregnancy. Use a soft toothbrush to brush your teeth. Be gentle when you floss.  Avoid cat litter boxes and soil  used by cats. These carry germs that can cause birth defects in the baby and can cause a loss of your baby (miscarriage) or stillbirth.  Keep all your prenatal visits as told by your doctor. This is important. Contact a doctor if:  You are not sure if you are in labor or if your water has broken.  You are dizzy.  You have mild cramps or pressure in your lower belly.  You have a nagging pain in your belly area.  You continue to feel sick to your stomach, you throw up, or you have watery poop.  You have bad smelling fluid coming from your vagina.  You have  pain when you pee. Get help right away if:  You have a fever.  You are leaking fluid from your vagina.  You are spotting or bleeding from your vagina.  You have severe belly cramps or pain.  You lose or gain weight quickly.  You have trouble catching your breath and have chest pain.  You notice sudden or extreme puffiness (swelling) of your face, hands, ankles, feet, or legs.  You have not felt the baby move in over an hour.  You have severe headaches that do not go away with medicine.  You have trouble seeing.  You are leaking, or you are having a gush of fluid, from your vagina before you are 37 weeks.  You have regular belly spasms (contractions) before you are 37 weeks. Summary  The third trimester is from week 28 through week 40 (months 7 through 9). This time is when your unborn baby is growing very fast.  Follow your doctor's advice about medicine, food, and activity.  Get ready for the arrival of your baby by taking prenatal classes, getting all the baby items ready, preparing the baby's room, and visiting your doctor to be checked.  Get help right away if you are bleeding from your vagina, or you have chest pain and trouble catching your breath, or if you have not felt your baby move in over an hour. This information is not intended to replace advice given to you by your health care provider. Make sure you discuss any questions you have with your health care provider. Document Revised: 01/15/2019 Document Reviewed: 10/30/2016 Elsevier Patient Education  2020 [employer].   Common Medications Safe in Pregnancy  Acne:      Constipation:  Benzoyl Peroxide     Colace  Clindamycin      Dulcolax Suppository  Topica Erythromycin     Fibercon  Salicylic Acid      Metamucil         Miralax AVOID:        Senakot   Accutane    Cough:  Retin-A       Cough Drops  Tetracycline      Phenergan w/ Codeine if Rx  Minocycline      Robitussin (Plain &  DM)  Antibiotics:     Crabs/Lice:  Ceclor       RID  Cephalosporins    AVOID:  E-Mycins      Kwell  Keflex  Macrobid/Macrodantin   Diarrhea:  Penicillin      Kao-Pectate  Zithromax      Imodium AD         PUSH FLUIDS AVOID:       Cipro     Fever:  Tetracycline      Tylenol (Regular or Extra  Minocycline       Strength)  Levaquin  Extra Strength-Do not          Exceed 8 tabs/24 hrs Caffeine:        <23m/day (equiv. To 1 cup of coffee or  approx. 3 12 oz sodas)         Gas: Cold/Hayfever:       Gas-X  Benadryl      Mylicon  Claritin       Phazyme  **Claritin-D        Chlor-Trimeton    Headaches:  Dimetapp      ASA-Free Excedrin  Drixoral-Non-Drowsy     Cold Compress  Mucinex (Guaifenasin)     Tylenol (Regular or Extra  Sudafed/Sudafed-12 Hour     Strength)  **Sudafed PE Pseudoephedrine   Tylenol Cold & Sinus     Vicks Vapor Rub  Zyrtec  **AVOID if Problems With Blood Pressure         Heartburn: Avoid lying down for at least 1 hour after meals  Aciphex      Maalox     Rash:  Milk of Magnesia     Benadryl    Mylanta       1% Hydrocortisone Cream  Pepcid  Pepcid Complete   Sleep Aids:  Prevacid      Ambien   Prilosec       Benadryl  Rolaids       Chamomile Tea  Tums (Limit 4/day)     Unisom  Zantac       Tylenol PM         Warm milk-add vanilla or  Hemorrhoids:       Sugar for taste  Anusol/Anusol H.C.  (RX: Analapram 2.5%)  Sugar Substitutes:  Hydrocortisone OTC     Ok in moderation  Preparation H      Tucks        Vaseline lotion applied to tissue with wiping    Herpes:     Throat:  Acyclovir      Oragel  Famvir  Valtrex     Vaccines:         Flu Shot Leg Cramps:       *Gardasil  Benadryl      Hepatitis A         Hepatitis B Nasal Spray:       Pneumovax  Saline Nasal Spray     Polio Booster         Tetanus Nausea:       Tuberculosis test or PPD  Vitamin B6 25 mg TID   AVOID:    Dramamine      *Gardasil  Emetrol       Live  Poliovirus  Ginger Root 250 mg QID    MMR (measles, mumps &  High Complex Carbs @ Bedtime    rebella)  Sea Bands-Accupressure    Varicella (Chickenpox)  Unisom 1/2 tab TID     *No known complications           If received before Pain:         Known pregnancy;   Darvocet       Resume series after  Lortab        Delivery  Percocet    Yeast:   Tramadol      Femstat  Tylenol 3      Gyne-lotrimin  Ultram       Monistat  Vicodin           MISC:  All Sunscreens           Hair Coloring/highlights          Insect Repellant's          (Including DEET)         Mystic Tans    Breastfeeding Tips for a Good Latch Breastfeeding can be challenging, especially during the first few weeks after childbirth. It is normal to have some problems when you start to breastfeed your new baby, even if you have breastfed before. Latching is an important part of having a good breastfeeding experience. This refers to how your baby's mouth attaches to your nipple to breastfeed. Your baby may have trouble latching due to:  Poor positioning.  Nipple confusion. This can occur if you introduce a bottle or pacifier too early.  Abnormalities in your baby's mouth, tongue, or lips. This includes conditions like tongue-tie or cleft lip.  The shape of your nipples, such as nipples that are flat or turned in (inverted).  Your baby being born early (prematurely). Small babies often have a weak suck. Work with a breastfeeding specialist (Science writer) to find positions and strategies that will help make sure your baby has a good latch. How does this affect me? A poor latch may cause you to have problems such as:  Cracked or sore nipples.  Breasts becoming overfilled with milk (engorgement).  Plugged milk ducts.  Low milk supply.  Breast inflammation or infection. How does this affect my baby? A poor latch may cause your baby to not be able to feed effectively. As a result, he or she may have  trouble gaining weight. Follow these instructions at home: How to position your baby  Find a comfortable place to sit or lie down, with your neck and back well supported.  If you are seated, place a pillow or rolled-up blanket under your baby to bring him or her to the level of your breast.  Make sure that your baby's abdomen is facing your abdomen.  Try different positions to find one that works best for you and your baby. How to help your baby latch  To begin each breastfeeding session, gently massage your breast. With your fingertips, massage from your chest wall toward your nipple in a circular motion. This encourages milk flow. If your milk flows slowly, you may need to continue this action during feeding. Position your breast for an ideal latch. Support your breast with four fingers underneath and your thumb above your nipple. Keep your fingers away from your nipple and your baby's mouth. To help your baby latch, follow these steps: 1. Stroke your baby's lips gently with your finger or nipple. 2. When your baby's mouth is open wide enough, quickly bring your baby to your breast and place your entire nipple, and as much of the areolaas possible, into your baby's mouth. The areola is the colored area around your nipple. 3. Your baby's tongue should be between his or her lower gum and your breast. 4. More areola should be visible above your baby's upper lip than below the lower lip. 5. When your baby starts sucking, you will feel a gentle pull on your nipple, but you should not feel pain. Be patient. It is common for a baby to suck for about 2-3 minutes to start the flow of breast milk. 6. Make sure that your baby's mouth is correctly positioned around your nipple. Your baby's lips should create a seal on your breast and be turned outward (everted).  General instructions  Look for the following signs that your baby has successfully latched on to your nipple: ? The baby is quietly tugging  or quietly sucking without causing you pain. ? You hear the baby swallow after every 3 or 4 sucks. ? You see muscle movement above and in front of the baby's ears while he or she is sucking.  Be aware of these signs that your baby has not successfully latched on to your nipple: ? The baby makes sucking sounds or smacking sounds while nursing. ? You have nipple pain.  If your baby is not latched well, insert your little finger between your baby's gums and your nipple to break the seal. Then try to help your baby latch again. Contact a health care provider if:  You have cracking or soreness in your nipples that lasts longer than 1 week.  You have nipple pain. Nipple cracking and soreness are common during the first week after birth, but nipple pain is never normal.  You have breast engorgement that does not improve after 48-72 hours.  You have a plugged milk duct and a fever.  You follow suggestions for a good latch but you continue to have problems or concerns.  You have pus-like discharge coming from your breast.  Your baby is not gaining weight or loses weight. Summary  Many common breastfeeding challenges are caused by poor latching. Latching refers to how your baby's mouth attaches to your nipple during breastfeeding.  If problems continue, seek help from a lactation consultant in your community. He or she can assess you and your baby for any latching problems. The lactation consultant can work with you to develop a plan to overcome any breastfeeding challenges. This information is not intended to replace advice given to you by your health care provider. Make sure you discuss any questions you have with your health care provider. Document Revised: 01/14/2019 Document Reviewed: 05/08/2017 Elsevier Patient Education  [place].

## 2020-03-22 ENCOUNTER — Encounter: Payer: Self-pay | Admitting: Obstetrics and Gynecology

## 2020-03-22 ENCOUNTER — Ambulatory Visit (INDEPENDENT_AMBULATORY_CARE_PROVIDER_SITE_OTHER): Payer: Medicaid Other | Admitting: Obstetrics and Gynecology

## 2020-03-22 ENCOUNTER — Other Ambulatory Visit: Payer: Self-pay

## 2020-03-22 VITALS — BP 106/73 | HR 98 | Wt 214.6 lb

## 2020-03-22 DIAGNOSIS — O99891 Other specified diseases and conditions complicating pregnancy: Secondary | ICD-10-CM

## 2020-03-22 DIAGNOSIS — R252 Cramp and spasm: Secondary | ICD-10-CM

## 2020-03-22 DIAGNOSIS — S80862D Insect bite (nonvenomous), left lower leg, subsequent encounter: Secondary | ICD-10-CM

## 2020-03-22 DIAGNOSIS — Z3403 Encounter for supervision of normal first pregnancy, third trimester: Secondary | ICD-10-CM

## 2020-03-22 DIAGNOSIS — Z3A32 32 weeks gestation of pregnancy: Secondary | ICD-10-CM

## 2020-03-22 DIAGNOSIS — W57XXXD Bitten or stung by nonvenomous insect and other nonvenomous arthropods, subsequent encounter: Secondary | ICD-10-CM

## 2020-03-22 LAB — POCT URINALYSIS DIPSTICK OB
Bilirubin, UA: NEGATIVE
Blood, UA: NEGATIVE
Glucose, UA: NEGATIVE
Ketones, UA: NEGATIVE
Nitrite, UA: NEGATIVE
POC,PROTEIN,UA: NEGATIVE
Spec Grav, UA: 1.015 (ref 1.010–1.025)
Urobilinogen, UA: 0.2 E.U./dL
pH, UA: 6 (ref 5.0–8.0)

## 2020-03-22 NOTE — Progress Notes (Signed)
ROB: Patient seen in the ER several times this week for insect bite on left leg. Was prescribed antibiotics. Also recommended sitz baths/warm compresses as area has a rising, ~ 2 x 2 cm in left popliteal region.  Also notes missing last appointment for death in the family. Further discussed breastfeeding. Normal 28 week labs.  Medicaid finally active. Notes having leg cramps, eating bananas. Also recommend Tums or warm milk, leg massages, increasing hydration. RTC in 2 weeks.    The following were addressed during this visit:  Breastfeeding Education - Feeding on demand or baby-led feeding    Comments: Helps prevent breastfeeding complications, helps bring in good milk supply, prevents under or overfeeding, and helps baby feel content and satisfied   - Effective positioning and attachment    Comments: Helps my baby to get enough breast milk, helps to produce an adequate milk supply, and helps prevent nipple pain and damage   - Individualized Education    Comments: Contraindications to breastfeeding and other special medical conditions

## 2020-03-22 NOTE — Progress Notes (Signed)
ROB-Pt present for routine prenatal care. Pt stated having lower abd/vaginal pressure.

## 2020-04-05 ENCOUNTER — Encounter: Payer: Self-pay | Admitting: Obstetrics and Gynecology

## 2020-04-05 ENCOUNTER — Other Ambulatory Visit: Payer: Self-pay

## 2020-04-05 ENCOUNTER — Ambulatory Visit (INDEPENDENT_AMBULATORY_CARE_PROVIDER_SITE_OTHER): Payer: Medicaid Other | Admitting: Obstetrics and Gynecology

## 2020-04-05 VITALS — BP 111/77 | HR 95 | Wt 217.1 lb

## 2020-04-05 DIAGNOSIS — Z3403 Encounter for supervision of normal first pregnancy, third trimester: Secondary | ICD-10-CM

## 2020-04-05 DIAGNOSIS — Z3A34 34 weeks gestation of pregnancy: Secondary | ICD-10-CM

## 2020-04-05 LAB — POCT URINALYSIS DIPSTICK OB
Bilirubin, UA: NEGATIVE
Blood, UA: NEGATIVE
Glucose, UA: NEGATIVE
Ketones, UA: NEGATIVE
Nitrite, UA: NEGATIVE
Spec Grav, UA: 1.01 (ref 1.010–1.025)
Urobilinogen, UA: 0.2 E.U./dL
pH, UA: 6.5 (ref 5.0–8.0)

## 2020-04-05 NOTE — Addendum Note (Signed)
Addended by: Dorian Pod on: 04/05/2020 10:39 AM   Modules accepted: Orders

## 2020-04-05 NOTE — Progress Notes (Signed)
Patient comes in today for ROB visit. She has no concerns today.  

## 2020-04-05 NOTE — Patient Instructions (Signed)
Braxton Hicks Contractions °Contractions of the uterus can occur throughout pregnancy, but they are not always a sign that you are in labor. You may have practice contractions called Braxton Hicks contractions. These false labor contractions are sometimes confused with true labor. °What are Braxton Hicks contractions? °Braxton Hicks contractions are tightening movements that occur in the muscles of the uterus before labor. Unlike true labor contractions, these contractions do not result in opening (dilation) and thinning of the cervix. Toward the end of pregnancy (32-34 weeks), Braxton Hicks contractions can happen more often and may become stronger. These contractions are sometimes difficult to tell apart from true labor because they can be very uncomfortable. You should not feel embarrassed if you go to the hospital with false labor. °Sometimes, the only way to tell if you are in true labor is for your health care provider to look for changes in the cervix. The health care provider will do a physical exam and may monitor your contractions. If you are not in true labor, the exam should show that your cervix is not dilating and your water has not broken. °If there are no other health problems associated with your pregnancy, it is completely safe for you to be sent home with false labor. You may continue to have Braxton Hicks contractions until you go into true labor. °How to tell the difference between true labor and false labor °True labor °· Contractions last 30-70 seconds. °· Contractions become very regular. °· Discomfort is usually felt in the top of the uterus, and it spreads to the lower abdomen and low back. °· Contractions do not go away with walking. °· Contractions usually become more intense and increase in frequency. °· The cervix dilates and gets thinner. °False labor °· Contractions are usually shorter and not as strong as true labor contractions. °· Contractions are usually irregular. °· Contractions  are often felt in the front of the lower abdomen and in the groin. °· Contractions may go away when you walk around or change positions while lying down. °· Contractions get weaker and are shorter-lasting as time goes on. °· The cervix usually does not dilate or become thin. °Follow these instructions at home: ° °· Take over-the-counter and prescription medicines only as told by your health care provider. °· Keep up with your usual exercises and follow other instructions from your health care provider. °· Eat and drink lightly if you think you are going into labor. °· If Braxton Hicks contractions are making you uncomfortable: °? Change your position from lying down or resting to walking, or change from walking to resting. °? Sit and rest in a tub of warm water. °? Drink enough fluid to keep your urine pale yellow. Dehydration may cause these contractions. °? Do slow and deep breathing several times an hour. °· Keep all follow-up prenatal visits as told by your health care provider. This is important. °Contact a health care provider if: °· You have a fever. °· You have continuous pain in your abdomen. °Get help right away if: °· Your contractions become stronger, more regular, and closer together. °· You have fluid leaking or gushing from your vagina. °· You pass blood-tinged mucus (bloody show). °· You have bleeding from your vagina. °· You have low back pain that you never had before. °· You feel your baby’s head pushing down and causing pelvic pressure. °· Your baby is not moving inside you as much as it used to. °Summary °· Contractions that occur before labor are   called Braxton Hicks contractions, false labor, or practice contractions. °· Braxton Hicks contractions are usually shorter, weaker, farther apart, and less regular than true labor contractions. True labor contractions usually become progressively stronger and regular, and they become more frequent. °· Manage discomfort from Braxton Hicks contractions  by changing position, resting in a warm bath, drinking plenty of water, or practicing deep breathing. °This information is not intended to replace advice given to you by your health care provider. Make sure you discuss any questions you have with your health care provider. °Document Revised: 09/06/2017 Document Reviewed: 02/07/2017 °Elsevier Patient Education © 2020 Elsevier Inc. ° °

## 2020-04-05 NOTE — Progress Notes (Signed)
ROB: Having occasional contractions.  Signs and symptoms of labor discussed.  Daily fetal movement.  Urine sent for C&S.  Cultures next visit.

## 2020-04-08 LAB — URINE CULTURE

## 2020-04-13 ENCOUNTER — Other Ambulatory Visit: Payer: Self-pay | Admitting: Surgical

## 2020-04-13 MED ORDER — NITROFURANTOIN MONOHYD MACRO 100 MG PO CAPS
100.0000 mg | ORAL_CAPSULE | Freq: Two times a day (BID) | ORAL | 0 refills | Status: DC
Start: 2020-04-13 — End: 2020-05-07

## 2020-04-19 ENCOUNTER — Other Ambulatory Visit: Payer: Self-pay

## 2020-04-19 ENCOUNTER — Ambulatory Visit (INDEPENDENT_AMBULATORY_CARE_PROVIDER_SITE_OTHER): Payer: Medicaid Other | Admitting: Obstetrics and Gynecology

## 2020-04-19 ENCOUNTER — Encounter: Payer: Self-pay | Admitting: Obstetrics and Gynecology

## 2020-04-19 ENCOUNTER — Other Ambulatory Visit (HOSPITAL_COMMUNITY)
Admission: RE | Admit: 2020-04-19 | Discharge: 2020-04-19 | Disposition: A | Discharge status: Home / Self Care | Payer: Medicaid Other | Source: Ambulatory Visit | Attending: Obstetrics and Gynecology | Admitting: Obstetrics and Gynecology

## 2020-04-19 VITALS — BP 113/79 | HR 92 | Wt 220.8 lb

## 2020-04-19 DIAGNOSIS — Z3A36 36 weeks gestation of pregnancy: Secondary | ICD-10-CM

## 2020-04-19 DIAGNOSIS — N898 Other specified noninflammatory disorders of vagina: Secondary | ICD-10-CM | POA: Diagnosis not present

## 2020-04-19 DIAGNOSIS — Z3403 Encounter for supervision of normal first pregnancy, third trimester: Secondary | ICD-10-CM

## 2020-04-19 LAB — POCT URINALYSIS DIPSTICK OB
Bilirubin, UA: NEGATIVE
Blood, UA: NEGATIVE
Glucose, UA: NEGATIVE
Nitrite, UA: NEGATIVE
Spec Grav, UA: 1.025 (ref 1.010–1.025)
Urobilinogen, UA: 0.2 E.U./dL
pH, UA: 6.5 (ref 5.0–8.0)

## 2020-04-19 NOTE — Progress Notes (Signed)
ROB-Pt present for routine prenatal care and 36 week cultures. Pt c/o of tightness in the abd area, swelling in legs/feet, back pain and vaginal pain and pressure.

## 2020-04-19 NOTE — Progress Notes (Signed)
ROB: Patient notes pelvic pressure. Given handouts on info for exercises. Discussed labor plans, concerned about epidural and labor. Discussed concerns. 36 week labs done today. Thick vaginal discharge noted, will do Nuswab. RTC in 1 week.

## 2020-04-19 NOTE — Patient Instructions (Addendum)
Pain Relief During Labor and Delivery Many things can cause pain during labor and delivery, including:  Pressure on bones and ligaments due to the baby moving through the pelvis.  Stretching of tissues due to the baby moving through the birth canal.  Muscle tension due to anxiety or nervousness.  The uterus tightening (contracting) and relaxing to help move the baby. There are many ways to deal with the pain of labor and delivery. They include:  Taking prenatal classes. Taking these classes helps you know what to expect during your baby's birth. What you learn will increase your confidence and decrease your anxiety.  Practicing relaxation techniques or doing relaxing activities, such as: ? Focused breathing. ? Meditation. ? Visualization. ? Aroma therapy. ? Listening to your favorite music. ? Hypnosis.  Taking a warm shower or bath (hydrotherapy). This may: ? Provide comfort and relaxation. ? Lessen your perception of pain. ? Decrease the amount of pain medicine needed. ? Decrease the length of labor.  Getting a massage or counterpressure on your back.  Applying warm packs or ice packs.  Changing positions often, moving around, or using a birthing ball.  Getting: ? Pain medicine through an IV or injection into a muscle. ? Pain medicine inserted into your spinal column. ? Injections of sterile water just under the skin on your lower back (intradermal injections). ? Laughing gas (nitrous oxide). Discuss your pain control options with your health care provider during your prenatal visits. Explore the options offered by your hospital or birth center. What kinds of medicine are available? There are two kinds of medicines that can be used to relieve pain during labor and delivery:  Analgesics. These medicines decrease pain without causing you to lose feeling or the ability to move your muscles.  Anesthetics. These medicines block feeling in the body and can decrease your  ability to move freely. Both of these kinds of medicine can cause minor side effects, such as nausea, trouble concentrating, and sleepiness. They can also decrease the baby's heart rate before birth and affect the baby's breathing rate after birth. For this reason, health care providers are careful about when and how much medicine is given. What are specific medicines and procedures that provide pain relief? Local Anesthetics Local anesthetics are used to numb a small area of the body. They may be used along with another kind of anesthetic or used to numb the nerves of the vagina, cervix, and perineum during the second stage of labor. General Anesthetics General anesthetics cause you to lose consciousness so you do not feel pain. They are usually only used for an emergency cesarean delivery. General anesthetics are given through an IV tube and a mask. Pudendal Block A pudendal block is a form of local anesthetic. It may be used to relieve the pain associated with pushing or stretching of the perineum at the time of delivery or to further numb the perineum. A pudendal block is done by injecting numbing medicine through the vaginal wall into a nerve in the pelvis. Epidural Analgesia Epidural analgesia is given through a flexible IV catheter that is inserted into the lower back. Numbing medicine is delivered continuously to the area near your spinal column nerves (epidural space). After having this type of analgesia, you may be able to move your legs but you most likely will not be able to walk. Depending on the amount of medicine given, you may lose all feeling in the lower half of your body, or you may retain some level   of sensation, including the urge to push. Epidural analgesia can be used to provide pain relief for a vaginal birth. Spinal Block A spinal block is similar to epidural analgesia, but the medicine is injected into the spinal fluid instead of the epidural space. A spinal block is only given  once. It starts to relieve pain quickly, but the pain relief lasts only 1-6 hours. Spinal blocks can be used for cesarean deliveries. Combined Spinal-Epidural (CSE) Block A CSE block combines the effects of a spinal block and epidural analgesia. The spinal block works quickly to block all pain. The epidural analgesia provides continuous pain relief, even after the effects of the spinal block have worn off. This information is not intended to replace advice given to you by your health care provider. Make sure you discuss any questions you have with your health care provider. Document Revised: 09/06/2017 Document Reviewed: 02/15/2016 Elsevier Patient Education  2020 [employer].    Signs and Symptoms of Labor Labor is your body's natural process of moving your baby, placenta, and umbilical cord out of your uterus. The process of labor usually starts when your baby is full-term, between 56 and 40 weeks of pregnancy. How will I know when I am close to going into labor? As your body prepares for labor and the birth of your baby, you may notice the following symptoms in the weeks and days before true labor starts:  Having a strong desire to get your home ready to receive your new baby. This is called nesting. Nesting may be a sign that labor is approaching, and it may occur several weeks before birth. Nesting may involve cleaning and organizing your home.  Passing a small amount of thick, bloody mucus out of your vagina (normal bloody show or losing your mucus plug). This may happen more than a week before labor begins, or it might occur right before labor begins as the opening of the cervix starts to widen (dilate). For some women, the entire mucus plug passes at once. For others, smaller portions of the mucus plug may gradually pass over several days.  Your baby moving (dropping) lower in your pelvis to get into position for birth (lightening). When this happens, you may feel more pressure on your  bladder and pelvic bone and less pressure on your ribs. This may make it easier to breathe. It may also cause you to need to urinate more often and have problems with bowel movements.  Having "practice contractions" (Braxton Hicks contractions) that occur at irregular (unevenly spaced) intervals that are more than 10 minutes apart. This is also called false labor. False labor contractions are common after exercise or sexual activity, and they will stop if you change position, rest, or drink fluids. These contractions are usually mild and do not get stronger over time. They may feel like: ? A backache or back pain. ? Mild cramps, similar to menstrual cramps. ? Tightening or pressure in your abdomen. Other early symptoms that labor may be starting soon include:  Nausea or loss of appetite.  Diarrhea.  Having a sudden burst of energy, or feeling very tired.  Mood changes.  Having trouble sleeping. How will I know when labor has begun? Signs that true labor has begun may include:  Having contractions that come at regular (evenly spaced) intervals and increase in intensity. This may feel like more intense tightening or pressure in your abdomen that moves to your back. ? Contractions may also feel like rhythmic pain in your upper  thighs or back that comes and goes at regular intervals. ? For first-time mothers, this change in intensity of contractions often occurs at a more gradual pace. ? Women who have given birth before may notice a more rapid progression of contraction changes.  Having a feeling of pressure in the vaginal area.  Your water breaking (rupture of membranes). This is when the sac of fluid that surrounds your baby breaks. When this happens, you will notice fluid leaking from your vagina. This may be clear or blood-tinged. Labor usually starts within 24 hours of your water breaking, but it may take longer to begin. ? Some women notice this as a gush of fluid. ? Others notice  that their underwear repeatedly becomes damp. Follow these instructions at home:   When labor starts, or if your water breaks, call your health care provider or nurse care line. Based on your situation, they will determine when you should go in for an exam.  When you are in early labor, you may be able to rest and manage symptoms at home. Some strategies to try at home include: ? Breathing and relaxation techniques. ? Taking a warm bath or shower. ? Listening to music. ? Using a heating pad on the lower back for pain. If you are directed to use heat:  Place a towel between your skin and the heat source.  Leave the heat on for 20-30 minutes.  Remove the heat if your skin turns bright red. This is especially important if you are unable to feel pain, heat, or cold. You may have a greater risk of getting burned. Get help right away if:  You have painful, regular contractions that are 5 minutes apart or less.  Labor starts before you are [redacted] weeks along in your pregnancy.  You have a fever.  You have a headache that does not go away.  You have bright red blood coming from your vagina.  You do not feel your baby moving.  You have a sudden onset of: ? Severe headache with vision problems. ? Nausea, vomiting, or diarrhea. ? Chest pain or shortness of breath. These symptoms may be an emergency. If your health care provider recommends that you go to the hospital or birth center where you plan to deliver, do not drive yourself. Have someone else drive you, or call emergency services (911 in the [place] Summary  Labor is your body's natural process of moving your baby, placenta, and umbilical cord out of your uterus.  The process of labor usually starts when your baby is full-term, between 79 and 40 weeks of pregnancy.  When labor starts, or if your water breaks, call your health care provider or nurse care line. Based on your situation, they will determine when you should go in for an  exam. This information is not intended to replace advice given to you by your health care provider. Make sure you discuss any questions you have with your health care provider. Document Revised: 06/24/2017 Document Reviewed: 03/01/2017 Elsevier Patient Education  2020 [employer].   [facility]  [address] [place], [place], [place] [address]  Phone: [phone]   [facility] (second location)  [address] [place], [place] [address]  Phone: [phone]   [facility]) [address] [place] [address] [place], [place], [place] [address] Phone: [phone]   [facility]  [address] [place], [place] [address]  Phone: [phone]

## 2020-04-20 LAB — CERVICOVAGINAL ANCILLARY ONLY
Bacterial Vaginitis (gardnerella): POSITIVE — AB
Candida Glabrata: NEGATIVE
Candida Vaginitis: POSITIVE — AB
Chlamydia: NEGATIVE
Comment: NEGATIVE
Comment: NEGATIVE
Comment: NEGATIVE
Comment: NEGATIVE
Comment: NEGATIVE
Comment: NORMAL
Neisseria Gonorrhea: NEGATIVE
Trichomonas: NEGATIVE

## 2020-04-20 MED ORDER — METRONIDAZOLE 500 MG PO TABS
500.0000 mg | ORAL_TABLET | Freq: Two times a day (BID) | ORAL | 0 refills | Status: DC
Start: 2020-04-20 — End: 2020-05-07

## 2020-04-20 MED ORDER — FLUCONAZOLE 150 MG PO TABS: 150.0000 mg | ORAL_TABLET | Freq: Once | ORAL | 3 refills | Status: AC

## 2020-04-20 NOTE — Addendum Note (Signed)
Addended by: Fabian November on: 04/20/2020 09:55 AM   Modules accepted: Orders

## 2020-04-21 LAB — STREP GP B NAA: Strep Gp B NAA: POSITIVE — AB

## 2020-04-26 ENCOUNTER — Ambulatory Visit (INDEPENDENT_AMBULATORY_CARE_PROVIDER_SITE_OTHER): Payer: Medicaid Other | Admitting: Obstetrics and Gynecology

## 2020-04-26 ENCOUNTER — Other Ambulatory Visit: Payer: Self-pay

## 2020-04-26 ENCOUNTER — Encounter: Payer: Self-pay | Admitting: Obstetrics and Gynecology

## 2020-04-26 VITALS — BP 114/79 | HR 87 | Wt 221.3 lb

## 2020-04-26 DIAGNOSIS — Z3A37 37 weeks gestation of pregnancy: Secondary | ICD-10-CM | POA: Diagnosis not present

## 2020-04-26 DIAGNOSIS — Z3403 Encounter for supervision of normal first pregnancy, third trimester: Secondary | ICD-10-CM

## 2020-04-26 LAB — POCT URINALYSIS DIPSTICK OB
Bilirubin, UA: NEGATIVE
Blood, UA: NEGATIVE
Glucose, UA: NEGATIVE
Ketones, UA: NEGATIVE
Leukocytes, UA: NEGATIVE
Nitrite, UA: NEGATIVE
Spec Grav, UA: <=1.005 — AB (ref 1.010–1.025)
Urobilinogen, UA: 0.2 E.U./dL
pH, UA: 6.5 (ref 5.0–8.0)

## 2020-04-26 NOTE — Progress Notes (Signed)
Patient was late for appointment and we agreed to see patient. I ask front staff to let her know we had one patient ahead of her that we was going to see. Patient left without being seen.

## 2020-05-02 NOTE — Progress Notes (Deleted)
ROB-Pt present for routine prenatal care. Pt stated  

## 2020-05-03 ENCOUNTER — Encounter: Payer: Medicaid Other | Admitting: Obstetrics and Gynecology

## 2020-05-05 ENCOUNTER — Inpatient Hospital Stay
Admission: EM | Admit: 2020-05-05 | Discharge: 2020-05-07 | DRG: 807 | Disposition: A | Discharge status: Home / Self Care | Payer: Medicaid Other | Attending: Obstetrics and Gynecology | Admitting: Obstetrics and Gynecology

## 2020-05-05 ENCOUNTER — Encounter: Payer: Self-pay | Admitting: Obstetrics and Gynecology

## 2020-05-05 ENCOUNTER — Other Ambulatory Visit: Payer: Self-pay

## 2020-05-05 DIAGNOSIS — Z3A38 38 weeks gestation of pregnancy: Secondary | ICD-10-CM

## 2020-05-05 DIAGNOSIS — Z20822 Contact with and (suspected) exposure to covid-19: Secondary | ICD-10-CM | POA: Diagnosis present

## 2020-05-05 DIAGNOSIS — O99824 Streptococcus B carrier state complicating childbirth: Secondary | ICD-10-CM | POA: Diagnosis present

## 2020-05-05 DIAGNOSIS — D563 Thalassemia minor: Secondary | ICD-10-CM | POA: Diagnosis present

## 2020-05-05 DIAGNOSIS — Z3490 Encounter for supervision of normal pregnancy, unspecified, unspecified trimester: Secondary | ICD-10-CM

## 2020-05-05 DIAGNOSIS — O26893 Other specified pregnancy related conditions, third trimester: Secondary | ICD-10-CM | POA: Diagnosis present

## 2020-05-05 LAB — CBC WITH DIFFERENTIAL/PLATELET
Abs Immature Granulocytes: 0.11 10*3/uL — ABNORMAL HIGH (ref 0.00–0.07)
Basophils Absolute: 0.1 10*3/uL (ref 0.0–0.1)
Basophils Relative: 0 %
Eosinophils Absolute: 0 10*3/uL (ref 0.0–0.5)
Eosinophils Relative: 0 %
HCT: 37.3 % (ref 36.0–46.0)
Hemoglobin: 12.3 g/dL (ref 12.0–15.0)
Immature Granulocytes: 1 %
Lymphocytes Relative: 25 %
Lymphs Abs: 2.9 10*3/uL (ref 0.7–4.0)
MCH: 27.5 pg (ref 26.0–34.0)
MCHC: 33 g/dL (ref 30.0–36.0)
MCV: 83.3 fL (ref 80.0–100.0)
Monocytes Absolute: 0.9 10*3/uL (ref 0.1–1.0)
Monocytes Relative: 8 %
Neutro Abs: 7.6 10*3/uL (ref 1.7–7.7)
Neutrophils Relative %: 66 %
Platelets: 262 10*3/uL (ref 150–400)
RBC: 4.48 MIL/uL (ref 3.87–5.11)
RDW: 14.9 % (ref 11.5–15.5)
WBC: 11.5 10*3/uL — ABNORMAL HIGH (ref 4.0–10.5)
nRBC: 0 % (ref 0.0–0.2)

## 2020-05-05 LAB — TYPE AND SCREEN
ABO/RH(D): O POS
Antibody Screen: NEGATIVE

## 2020-05-05 LAB — RESPIRATORY PANEL BY RT PCR (FLU A&B, COVID)
Influenza A by PCR: NEGATIVE
Influenza B by PCR: NEGATIVE
SARS Coronavirus 2 by RT PCR: NEGATIVE

## 2020-05-05 MED ORDER — ZOLPIDEM TARTRATE 5 MG PO TABS: 5.0000 mg | ORAL_TABLET | Freq: Every evening | ORAL | Status: DC | PRN

## 2020-05-05 MED ORDER — OXYCODONE-ACETAMINOPHEN 5-325 MG PO TABS: 1.0000 | ORAL_TABLET | ORAL | Status: DC | PRN

## 2020-05-05 MED ORDER — SIMETHICONE 80 MG PO CHEW: 80.0000 mg | CHEWABLE_TABLET | ORAL | Status: DC | PRN

## 2020-05-05 MED ORDER — OXYTOCIN-SODIUM CHLORIDE 30-0.9 UT/500ML-% IV SOLN
500.0000 m[IU]/h | INTRAVENOUS | Status: DC
  Administered 2020-05-05: 500 m[IU]/h via INTRAVENOUS

## 2020-05-05 MED ORDER — IBUPROFEN 600 MG PO TABS
600.0000 mg | ORAL_TABLET | Freq: Four times a day (QID) | ORAL | Status: DC
  Administered 2020-05-05 – 2020-05-07 (×8): 600 mg via ORAL
  Filled 2020-05-05 (×8): qty 1

## 2020-05-05 MED ORDER — PRENATAL MULTIVITAMIN CH
1.0000 | ORAL_TABLET | Freq: Every day | ORAL | Status: DC
  Administered 2020-05-05 – 2020-05-06 (×2): 1 via ORAL
  Filled 2020-05-05 (×2): qty 1

## 2020-05-05 MED ORDER — ACETAMINOPHEN 325 MG PO TABS
650.0000 mg | ORAL_TABLET | ORAL | Status: DC | PRN
  Administered 2020-05-05 (×2): 650 mg via ORAL
  Filled 2020-05-05 (×2): qty 2

## 2020-05-05 MED ORDER — BENZOCAINE-MENTHOL 20-0.5 % EX AERO
INHALATION_SPRAY | CUTANEOUS | Status: AC
  Filled 2020-05-05: qty 56

## 2020-05-05 MED ORDER — AMMONIA AROMATIC IN INHA
RESPIRATORY_TRACT | Status: AC
  Filled 2020-05-05: qty 10

## 2020-05-05 MED ORDER — BUTORPHANOL TARTRATE 1 MG/ML IJ SOLN
2.0000 mg | Freq: Once | INTRAMUSCULAR | Status: AC
  Administered 2020-05-05: 2 mg via INTRAVENOUS

## 2020-05-05 MED ORDER — BENZOCAINE-MENTHOL 20-0.5 % EX AERO: 1.0000 "application " | INHALATION_SPRAY | CUTANEOUS | Status: DC | PRN

## 2020-05-05 MED ORDER — OXYTOCIN-SODIUM CHLORIDE 30-0.9 UT/500ML-% IV SOLN: 2.5000 [IU]/h | INTRAVENOUS | Status: DC | PRN

## 2020-05-05 MED ORDER — LIDOCAINE HCL (PF) 1 % IJ SOLN
30.0000 mL | Freq: Once | INTRAMUSCULAR | Status: AC
  Administered 2020-05-05: 30 mL via SUBCUTANEOUS

## 2020-05-05 MED ORDER — MISOPROSTOL 200 MCG PO TABS
ORAL_TABLET | ORAL | Status: AC
  Filled 2020-05-05: qty 4

## 2020-05-05 MED ORDER — OXYTOCIN 10 UNIT/ML IJ SOLN
INTRAMUSCULAR | Status: AC
  Filled 2020-05-05: qty 2

## 2020-05-05 MED ORDER — TETANUS-DIPHTH-ACELL PERTUSSIS 5-2.5-18.5 LF-MCG/0.5 IM SUSP: 0.5000 mL | Freq: Once | INTRAMUSCULAR | Status: DC

## 2020-05-05 MED ORDER — DIPHENHYDRAMINE HCL 25 MG PO CAPS: 25.0000 mg | ORAL_CAPSULE | Freq: Four times a day (QID) | ORAL | Status: DC | PRN

## 2020-05-05 MED ORDER — BUTORPHANOL TARTRATE 1 MG/ML IJ SOLN
INTRAMUSCULAR | Status: AC
  Filled 2020-05-05: qty 2

## 2020-05-05 MED ORDER — OXYTOCIN-SODIUM CHLORIDE 30-0.9 UT/500ML-% IV SOLN
INTRAVENOUS | Status: AC
  Filled 2020-05-05: qty 500

## 2020-05-05 MED ORDER — DOCUSATE SODIUM 100 MG PO CAPS
100.0000 mg | ORAL_CAPSULE | Freq: Two times a day (BID) | ORAL | Status: DC
  Administered 2020-05-05 – 2020-05-07 (×4): 100 mg via ORAL
  Filled 2020-05-05 (×4): qty 1

## 2020-05-05 MED ORDER — OXYTOCIN-SODIUM CHLORIDE 30-0.9 UT/500ML-% IV SOLN
2.5000 [IU]/h | INTRAVENOUS | Status: DC
  Administered 2020-05-05: 2.5 [IU]/h via INTRAVENOUS

## 2020-05-05 MED ORDER — LIDOCAINE HCL (PF) 1 % IJ SOLN
INTRAMUSCULAR | Status: AC
  Filled 2020-05-05: qty 30

## 2020-05-05 MED ORDER — OXYTOCIN-SODIUM CHLORIDE 30-0.9 UT/500ML-% IV SOLN: 2.5000 [IU]/h | INTRAVENOUS | Status: DC

## 2020-05-05 NOTE — Plan of Care (Signed)
Education Complete.

## 2020-05-05 NOTE — H&P (Signed)
     History and Physical   HPI  Deborah Strong is a 24 y.o. G1P0 at [redacted]w[redacted]d Estimated Date of Delivery: 05/13/20 who is being admitted for labor management.   OB History  OB History  Gravida Para Term Preterm AB Living  1 0 0 0 0 0  SAB TAB Ectopic Multiple Live Births  0 0 0 0 0    # Outcome Date GA Lbr Len/2nd Weight Sex Delivery Anes PTL Lv  1 Current             PROBLEM LIST  Pregnancy complications or risks: Patient Active Problem List   Diagnosis Date Noted  . Preterm uterine contractions in second trimester, antepartum 02/15/2020  . Alpha thalassemia silent carrier 12/03/2019  . Trichomonal vaginitis during pregnancy 11/06/2019    Prenatal labs and studies: ABO, Rh: O/Positive/-- (01/29 1126) Antibody: Negative (01/29 1126) Rubella: 1.45 (01/29 1126) RPR: Non Reactive (05/26 1404)  HBsAg: Negative (01/29 1126)  HIV: Non Reactive (01/29 1126)  JWJ:XBJYNWGN/-- (07/13 1343)   Past Medical History:  Diagnosis Date  . Medical history non-contributory   . Patient denies medical problems      Past Surgical History:  Procedure Laterality Date  . NO PAST SURGERIES    . no surical history       Medications    Current Discharge Medication List    CONTINUE these medications which have NOT CHANGED   Details  Prenatal Vit-Fe Fumarate-FA (MULTIVITAMIN-PRENATAL) 27-0.8 MG TABS tablet Take by mouth daily at 12 noon.    metroNIDAZOLE (FLAGYL) 500 MG tablet Take 1 tablet (500 mg total) by mouth 2 (two) times daily. Qty: 14 tablet, Refills: 0    nitrofurantoin, macrocrystal-monohydrate, (MACROBID) 100 MG capsule Take 1 capsule (100 mg total) by mouth 2 (two) times daily. Qty: 14 capsule, Refills: 0         Allergies  Patient has no known allergies.  Review of Systems  Pertinent items are noted in HPI.  Physical Exam  BP (!) 121/86 (BP Location: Left Arm)   Pulse 98   Temp 97.9 F (36.6 C) (Oral)   Resp 18   Ht 5\' 7"  (1.702 m)   Wt (!) 97.1  kg   LMP 08/18/2019 (Exact Date)   BMI 33.52 kg/m   Lungs:  CTA B Cardio: RRR without M/R/G Abd: Soft, gravid, NT Presentation: cephalic EXT: No C/C/ 1+ Edema DTRs: 2+ B CERVIX: Dilation: 10 Dilation Complete Date: 05/05/20 Dilation Complete Time: 0419 Effacement (%): 100 Station: -1 Presentation: Vertex Exam by:: [email or web address] RN  See Prenatal records for more detailed PE.     FHR:  Accelerations: Reactive  Toco: Uterine Contractions: Q 2 min  Test Results  No results found for this or any previous visit (from the past 24 hour(s)). Group B Strep positive  Assessment   G1P0 at [redacted]w[redacted]d Estimated Date of Delivery: 05/13/20  The fetus is reassuring.   Patient Active Problem List   Diagnosis Date Noted  . Preterm uterine contractions in second trimester, antepartum 02/15/2020  . Alpha thalassemia silent carrier 12/03/2019  . Trichomonal vaginitis during pregnancy 11/06/2019    Plan  1. Admit to L&D :   2. EFM: -- Category 1 3. Stadol or Epidural if desired.   4. Admission labs  5. Abx for GBS  11/08/2019, M.D. 05/05/2020 5:00 AM

## 2020-05-05 NOTE — OB Triage Note (Signed)
[facility] to [facility] with complaints of contractions. Pt stated that contractions began around midnight and have progressively became closer together. Pt denies vaginal bleeding, LOF and vomiting. Pt states that she has been able to feel her baby move. Vitals WNL. Monitors applied and assessing. Intial FHT 135.

## 2020-05-06 LAB — RPR: RPR Ser Ql: NONREACTIVE

## 2020-05-06 NOTE — Progress Notes (Signed)
Patient ID: Deborah Strong, female   DOB: August 27, 1996, 24 y.o.   MRN: [id number]   Progress Note - Vaginal Delivery  Deborah Strong is a 24 y.o. G1P1001 now PP day 1 s/p Vaginal, Spontaneous .   Subjective:  The patient reports no complaints, up ad lib, voiding and tolerating PO Bottlefeeding  Objective:  Vital signs in last 24 hours: Temp:  [97.7 F (36.5 C)-98.7 F (37.1 C)] 98.7 F (37.1 C) (07/29 2310) Pulse Rate:  [61-79] 78 (07/29 2310) Resp:  [18] 18 (07/29 2310) BP: (102-120)/(63-84) 112/63 (07/29 2310) SpO2:  [98 %-100 %] 98 % (07/29 2310)  Physical Exam:  General: alert, cooperative and no distress Lochia: appropriate Uterine Fundus: firm    Data Review Recent Labs    05/05/20 0413  HGB 12.3  HCT 37.3    Assessment/Plan: Active Problems:   Pregnancy   Normal labor    Baby must stay for GBS "watch"  Plan for discharge tomorrow   -- Continue routine PP care.     Elonda Husky, M.D. 05/06/2020 7:57 AM

## 2020-05-06 NOTE — Lactation Note (Signed)
This note was copied from a baby's chart. Mom of baby states that she would like to try breastfeeding once before going home. She has only been formula feeding since delivery.   I offered to assist her now but she would like to try later.  I informed her that she was welcome to try to latch baby on her own if she would like and then call me to assist.  She stated she would like to do this and she would call me if she decided to try breastfeeding and needed help.

## 2020-05-07 NOTE — Discharge Summary (Signed)
Patient Name: Deborah Strong DOB: 30-Dec-1995 MRN: [id number]                            Discharge Summary  Date of Admission: 05/05/2020 Date of Discharge: 05/07/2020 Delivering Provider: Linzie Collin   Admitting Diagnosis: Pregnancy [Z34.90] Normal labor [O80, Z37.9] at [redacted]w[redacted]d Secondary diagnosis:  Active Problems:   Pregnancy   Normal labor   Precipitate labor Mode of Delivery: normal spontaneous vaginal delivery              Discharge diagnosis: Term Pregnancy Delivered      Intrapartum Procedures: Atificial rupture of membranes   Post partum procedures:   Complications: none                     Discharge Day SOAP Note:  Progress Note - Vaginal Delivery  Deborah Strong is a 24 y.o. G1P1001 now PP day 2 s/p Vaginal, Spontaneous . Delivery was uncomplicated  Subjective  The patient has the following complaints: has no unusual complaints  Pain is controlled with current medications.   Patient is urinating without difficulty.  She is ambulating well.     Objective  Vital signs: BP 107/71 (BP Location: Left Arm)    Pulse 88    Temp 98.4 F (36.9 C) (Oral)    Resp 20    Ht 5\' 7"  (1.702 m)    Wt (!) 97.1 kg    LMP 08/18/2019 (Exact Date)    SpO2 99%    Breastfeeding Unknown    BMI 33.52 kg/m   Physical Exam: Gen: NAD Fundus Fundal Tone: Firm  Lochia Amount: Small        Data Review Labs: Lab Results  Component Value Date   WBC 11.5 (H) 05/05/2020   HGB 12.3 05/05/2020   HCT 37.3 05/05/2020   MCV 83.3 05/05/2020   PLT 262 05/05/2020   CBC Latest Ref Rng & Units 05/05/2020 03/02/2020 07/05/2012  WBC 4.0 - 10.5 K/uL 11.5(H) 10.9(H) 15.1(H)  Hemoglobin 12.0 - 15.0 g/dL 07/07/2012 59.1 63.8  Hematocrit 36 - 46 % 37.3 36.5 38.4  Platelets 150 - 400 K/uL 262 283 210   O POS  Edinburgh Score: Edinburgh Postnatal Depression Scale Screening Tool 05/05/2020  I have been able to laugh and see the funny side of things. 0  I have looked forward with enjoyment  to things. 0  I have blamed myself unnecessarily when things went wrong. 0  I have been anxious or worried for no good reason. 2  I have felt scared or panicky for no good reason. 2  Things have been getting on top of me. 1  I have been so unhappy that I have had difficulty sleeping. 0  I have felt sad or miserable. 1  I have been so unhappy that I have been crying. 1  The thought of harming myself has occurred to me. 0  Edinburgh Postnatal Depression Scale Total 7    Assessment/Plan  Active Problems:   Pregnancy   Normal labor    Plan for discharge today.  Discharge Instructions: Per After Visit Summary. Activity: Advance as tolerated. Pelvic rest for 6 weeks.  Also refer to After Visit Summary Diet: Regular Medications: Allergies as of 05/07/2020   No Known Allergies     Medication List    STOP taking these medications   metroNIDAZOLE 500 MG tablet Commonly known as: FLAGYL  nitrofurantoin (macrocrystal-monohydrate) 100 MG capsule Commonly known as: Macrobid     TAKE these medications   multivitamin-prenatal 27-0.8 MG Tabs tablet Take by mouth daily at 12 noon.      Outpatient follow up:   Follow-up Information    Linzie Collin, MD Follow up in 4 week(s).   Specialties: Obstetrics and Gynecology, Radiology Contact information: [address] [place] [address] [phone]              Postpartum contraception: Will discuss at first office visit post-partum  Discharged Condition: good  Discharged to: home  Newborn Data: Disposition:home with mother  Apgars: APGAR (1 MIN): 8   APGAR (5 MINS): 9   APGAR (10 MINS):    Baby Feeding: Bottle    Elonda Husky, M.D. 05/07/2020 10:40 AM

## 2020-05-07 NOTE — Progress Notes (Signed)
Discharge instructions given. Patient verbalizes understanding of teaching. Patient discharged home via wheelchair at 1145.

## 2020-06-07 ENCOUNTER — Encounter: Payer: Medicaid Other | Admitting: Obstetrics and Gynecology

## 2020-06-08 ENCOUNTER — Encounter: Payer: Medicaid Other | Admitting: Obstetrics and Gynecology

## 2020-06-16 ENCOUNTER — Encounter: Payer: Self-pay | Admitting: Obstetrics and Gynecology

## 2020-07-05 ENCOUNTER — Ambulatory Visit (INDEPENDENT_AMBULATORY_CARE_PROVIDER_SITE_OTHER): Payer: Medicaid Other | Admitting: Obstetrics and Gynecology

## 2020-07-05 ENCOUNTER — Other Ambulatory Visit: Payer: Self-pay

## 2020-07-05 ENCOUNTER — Encounter: Payer: Self-pay | Admitting: Obstetrics and Gynecology

## 2020-07-05 DIAGNOSIS — Z30011 Encounter for initial prescription of contraceptive pills: Secondary | ICD-10-CM

## 2020-07-05 MED ORDER — DESOGESTREL-ETHINYL ESTRADIOL 0.15-0.02/0.01 MG (21/5) PO TABS: 1.0000 | ORAL_TABLET | Freq: Every day | ORAL | 1 refills | Status: AC

## 2020-07-05 NOTE — Progress Notes (Signed)
HPI:      Deborah Strong is a 24 y.o. G1P1001 who LMP was No LMP recorded (lmp unknown).  Subjective:   She presents today approximately 2 months from delivery.  She reports no problems.  She has resumed intercourse.  She is bottlefeeding.  She has also returned to work without issue. She desires birth control.  She has chosen OCPs. Declines examination today.    Hx: The following portions of the patient's history were reviewed and updated as appropriate:             She  has a past medical history of Medical history non-contributory and Patient denies medical problems. She does not have any pertinent problems on file. She  has a past surgical history that includes no surical history and No past surgeries. Her family history includes Healthy in her father and mother. She  reports that she has never smoked. She has never used smokeless tobacco. She reports previous alcohol use. She reports that she does not use drugs. She has a current medication list which includes the following prescription(s): desogestrel-ethinyl estradiol. She has No Known Allergies.       Review of Systems:  Review of Systems  Constitutional: Denied constitutional symptoms, night sweats, recent illness, fatigue, fever, insomnia and weight loss.  Eyes: Denied eye symptoms, eye pain, photophobia, vision change and visual disturbance.  Ears/Nose/Throat/Neck: Denied ear, nose, throat or neck symptoms, hearing loss, nasal discharge, sinus congestion and sore throat.  Cardiovascular: Denied cardiovascular symptoms, arrhythmia, chest pain/pressure, edema, exercise intolerance, orthopnea and palpitations.  Respiratory: Denied pulmonary symptoms, asthma, pleuritic pain, productive sputum, cough, dyspnea and wheezing.  Gastrointestinal: Denied, gastro-esophageal reflux, melena, nausea and vomiting.  Genitourinary: Denied genitourinary symptoms including symptomatic vaginal discharge, pelvic relaxation issues, and urinary  complaints.  Musculoskeletal: Denied musculoskeletal symptoms, stiffness, swelling, muscle weakness and myalgia.  Dermatologic: Denied dermatology symptoms, rash and scar.  Neurologic: Denied neurology symptoms, dizziness, headache, neck pain and syncope.  Psychiatric: Denied psychiatric symptoms, anxiety and depression.  Endocrine: Denied endocrine symptoms including hot flashes and night sweats.   Meds:   No current outpatient medications on file prior to visit.   No current facility-administered medications on file prior to visit.    Objective:     Vitals:   07/05/20 1502  BP: 124/87  Pulse: 87                Assessment:    G1P1001 Patient Active Problem List   Diagnosis Date Noted  . Pregnancy 05/05/2020  . Normal labor 05/05/2020  . Preterm uterine contractions in second trimester, antepartum 02/15/2020  . Alpha thalassemia silent carrier 12/03/2019  . Trichomonal vaginitis during pregnancy 11/06/2019     1. Postpartum care and examination immediately after delivery   2. Initiation of OCP (BCP)     Patient doing very well postpartum.  Desires OCPs for birth control   Plan:            1.  Patient may resume all normal activities.  2.  OCPs The risks /benefits of OCPs have been explained to the patient in detail.  Product literature has been given to her where appropriate.  I have instructed her in the use of OCPs.  I have explained to the patient that OCPs are not as effective for birth control during the first month of use, and that another form of contraception should be used during this time.  Both first-day start and Sunday start have been explained.  The risks and benefits of each was discussed.  She has been made aware of  the fact that in rare circumstances, other medications may affect the efficacy of OCPs.  I have answered all of her questions, and I believe that she has an understanding of the effectiveness and use of OCPs. She has chosen to start pills  immediately and use other protection for the first month.  Patient to return for OCP follow-up and annual examination in January. Orders No orders of the defined types were placed in this encounter.    Meds ordered this encounter  Medications  . desogestrel-ethinyl estradiol (MIRCETTE) 0.15-0.02/0.01 MG (21/5) tablet    Sig: Take 1 tablet by mouth at bedtime.    Dispense:  84 tablet    Refill:  1      F/U  Return in about 4 months (around 11/04/2020) for Annual Physical.  Elonda Husky, M.D. 07/05/2020 3:22 PM

## 2020-08-12 ENCOUNTER — Encounter: Payer: Medicaid Other | Admitting: Obstetrics and Gynecology

## 2020-08-15 ENCOUNTER — Encounter: Payer: Self-pay | Admitting: Obstetrics and Gynecology

## 2020-11-01 ENCOUNTER — Encounter: Payer: Medicaid Other | Admitting: Obstetrics and Gynecology

## 2020-11-08 ENCOUNTER — Encounter: Payer: Self-pay | Admitting: Obstetrics and Gynecology

## 2021-11-17 ENCOUNTER — Encounter: Payer: Medicaid Other | Admitting: Obstetrics and Gynecology

## 2021-11-27 ENCOUNTER — Encounter: Payer: Medicaid Other | Admitting: Obstetrics and Gynecology

## 2021-11-27 DIAGNOSIS — Z01419 Encounter for gynecological examination (general) (routine) without abnormal findings: Secondary | ICD-10-CM

## 2021-12-09 ENCOUNTER — Other Ambulatory Visit: Payer: Self-pay

## 2021-12-09 ENCOUNTER — Emergency Department
Admission: EM | Admit: 2021-12-09 | Discharge: 2021-12-09 | Disposition: A | Discharge status: Home / Self Care | Payer: Medicaid Other | Attending: Emergency Medicine | Admitting: Emergency Medicine

## 2021-12-09 DIAGNOSIS — Z20822 Contact with and (suspected) exposure to covid-19: Secondary | ICD-10-CM | POA: Diagnosis not present

## 2021-12-09 DIAGNOSIS — B349 Viral infection, unspecified: Secondary | ICD-10-CM | POA: Diagnosis not present

## 2021-12-09 DIAGNOSIS — R519 Headache, unspecified: Secondary | ICD-10-CM | POA: Diagnosis present

## 2021-12-09 LAB — RESP PANEL BY RT-PCR (FLU A&B, COVID) ARPGX2
Influenza A by PCR: NEGATIVE
Influenza B by PCR: NEGATIVE
SARS Coronavirus 2 by RT PCR: NEGATIVE

## 2021-12-09 NOTE — Discharge Instructions (Signed)
Your Covid and Influenza testing are negative. ? ?Take Tylenol or ibuprofen for body aches, pain, fever. ? ?Take Mucinex for nasal congestion or cough. ? ?Follow up with primary care for symptoms not improving over the week. ? ?Return to the ER for symptoms that change or worsen if unable to schedule an appointment. ?

## 2021-12-09 NOTE — ED Triage Notes (Signed)
Pt comes pov with congestion, headache since this morning ?

## 2021-12-09 NOTE — ED Provider Notes (Signed)
? ?  [facility] ?Provider Note ? ? ? Event Date/Time  ? First MD Initiated Contact with Patient 12/09/21 1400   ?  (approximate) ? ? ?History  ? ?Headache (/) and Nasal Congestion ? ? ?HPI ? ?Deborah Strong is a 26 y.o. female with history of no chronic medical conditions and as listed in EMR presents to the emergency department for evaluation of congestion, headache, general malaise that started last night but was worse this morning.  No fever or cough.  No nausea, vomiting, diarrhea.. ? ?  ? ? ?Physical Exam  ? ?Triage Vital Signs: ?ED Triage Vitals [12/09/21 1334]  ?Enc Vitals Group  ?   BP 126/89  ?   Pulse Rate 83  ?   Resp 18  ?   Temp 98 ?F (36.7 ?C)  ?   Temp Source Oral  ?   SpO2 99 %  ?   Weight 200 lb (90.7 kg)  ?   Height 5\' 7"  (1.702 m)  ?   Head Circumference   ?   Peak Flow   ?   Pain Score 5  ?   Pain Loc   ?   Pain Edu?   ?   Excl. in GC?   ? ? ?Most recent vital signs: ?Vitals:  ? 12/09/21 1334 12/09/21 1530  ?BP: 126/89 128/84  ?Pulse: 83 80  ?Resp: 18 17  ?Temp: 98 ?F (36.7 ?C) 98.4 ?F (36.9 ?C)  ?SpO2: 99% 99%  ? ? ?General: Awake, no distress.  ?CV:  Good peripheral perfusion.  ?Resp:  Normal effort.  Breath sounds are clear to auscultation ?Abd:  No distention.  ?Other:   ? ? ?ED Results / Procedures / Treatments  ? ?Labs ?(all labs ordered are listed, but only abnormal results are displayed) ?Labs Reviewed  ?RESP PANEL BY RT-PCR (FLU A&B, COVID) [id number]  ? ? ? ?EKG ? ? ? ? ?RADIOLOGY ? ?Image and radiology report reviewed by me. ? ? ? ?PROCEDURES: ? ?Critical Care performed: No ? ?Procedures ? ? ?MEDICATIONS ORDERED IN ED: ?Medications - No data to display ? ? ?IMPRESSION / MDM / ASSESSMENT AND PLAN / ED COURSE  ? ?I have reviewed the triage note. ? ?Differential diagnosis includes, but is not limited to, COVID, influenza, viral syndrome ? ?26 year old female presenting to the emergency department for treatment and evaluation of symptoms as described in the HPI.   Awaiting COVID and influenza testing. ? ?COVID and influenza are negative.  Patient is to treat herself symptomatically with Tylenol, ibuprofen, decongestant. She is to follow up with primary care if symptoms are not improving over the next few days. For symptoms that change or worsen, she is to return to the ER if unable to schedule an appointment. ? ?  ? ? ?FINAL CLINICAL IMPRESSION(S) / ED DIAGNOSES  ? ?Final diagnoses:  ?Acute viral syndrome  ? ? ? ?Rx / DC Orders  ? ?ED Discharge Orders   ? ? None  ? ?  ? ? ? ?Note:  This document was prepared using Dragon voice recognition software and may include unintentional dictation errors. ?  ?32, FNP ?12/09/21 1931 ? ?  ?02/08/22, MD ?12/09/21 2204 ? ?

## 2021-12-24 IMAGING — US US EXTREM LOW VENOUS*L*
1 series · 14 of 24 positions shown · non-contrast
Comparison: None.

CLINICAL DATA: Left lower extremity pain and swelling tonight,
possible insect bite. Pregnant patient.

EXAM:
LEFT LOWER EXTREMITY VENOUS DOPPLER ULTRASOUND
TECHNIQUE: Gray-scale sonography with compression, as well as color and duplex
ultrasound, were performed to evaluate the deep venous system(s)
from the level of the common femoral vein through the popliteal and
proximal calf veins.

[Series 1: us venous img lower uni left (dvt) · portal-venous · 14 of 34 slices shown]
[im 1/34]
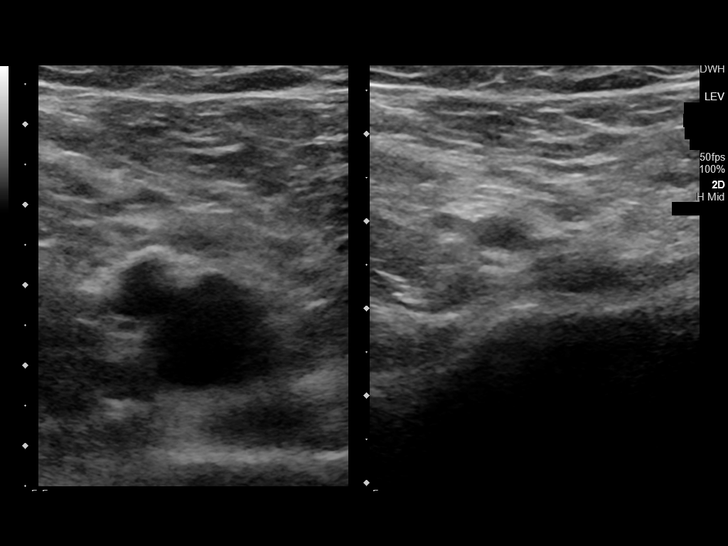
[im 3/34]
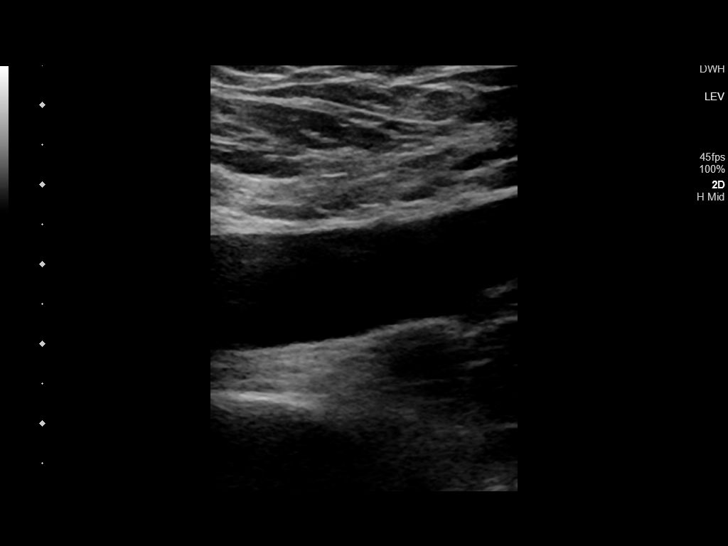
[im 6/34]
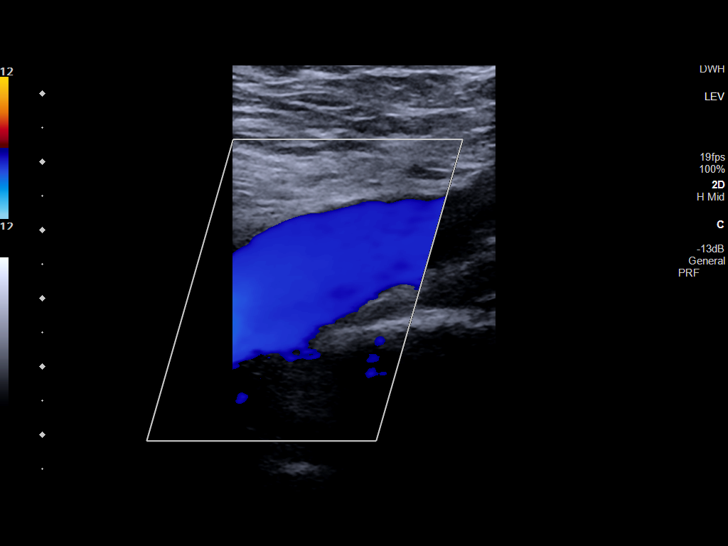
[im 9/34]
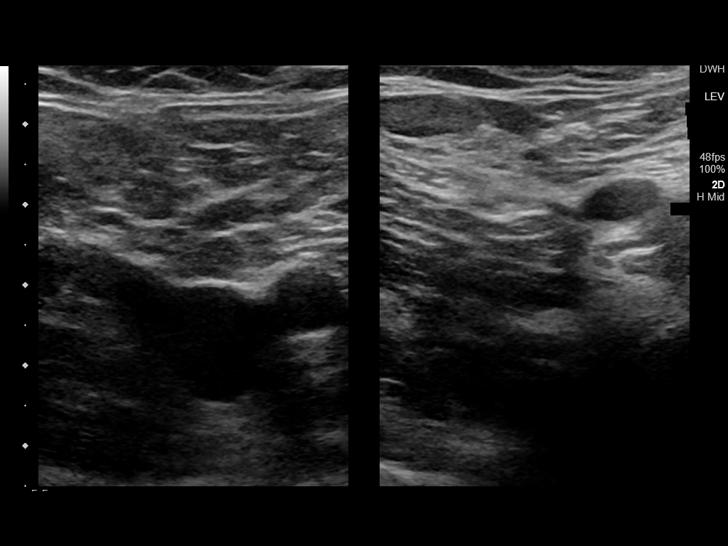
[im 11/34]
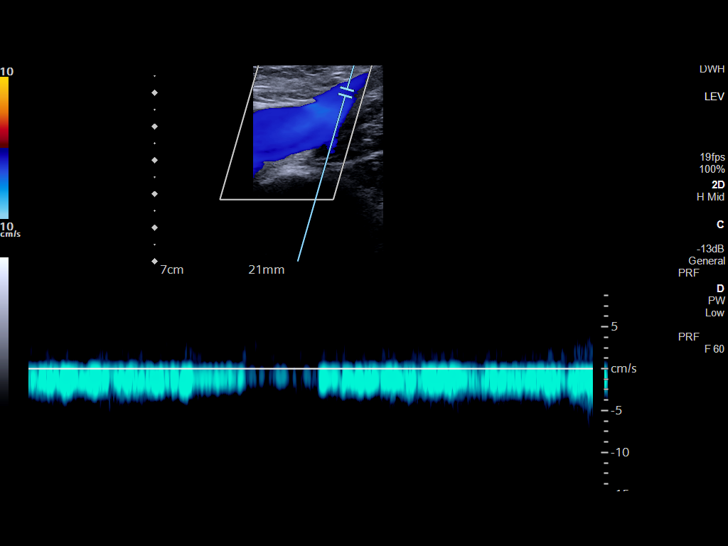
[im 13/34]
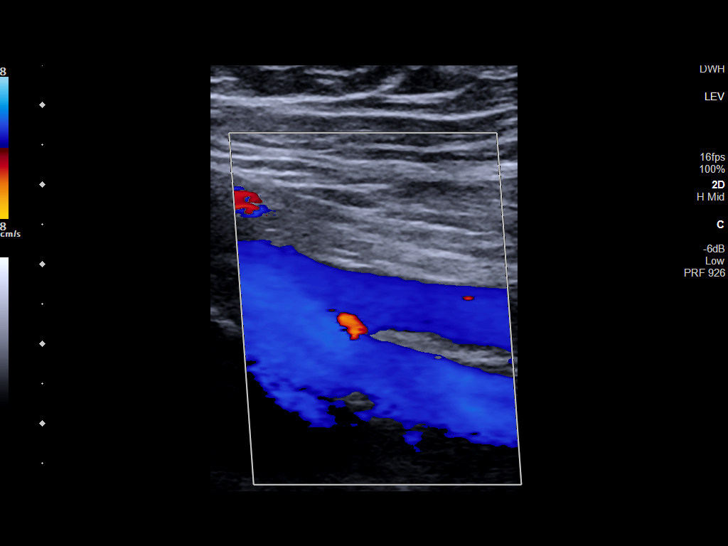
[im 16/34]
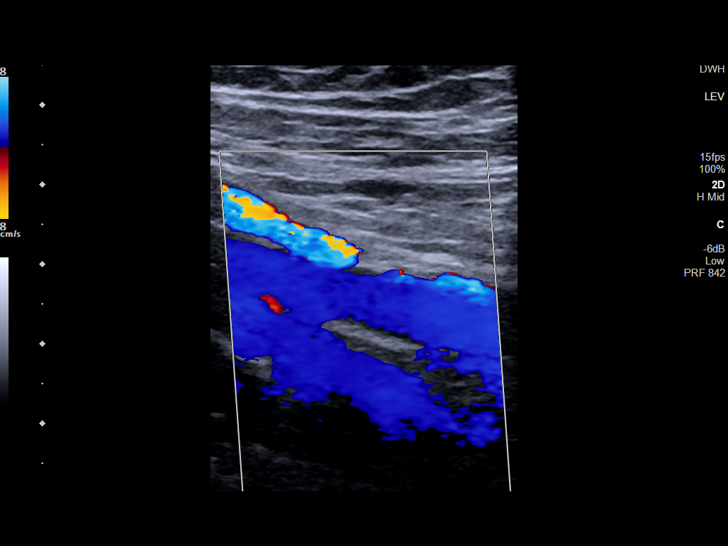
[im 18/34]
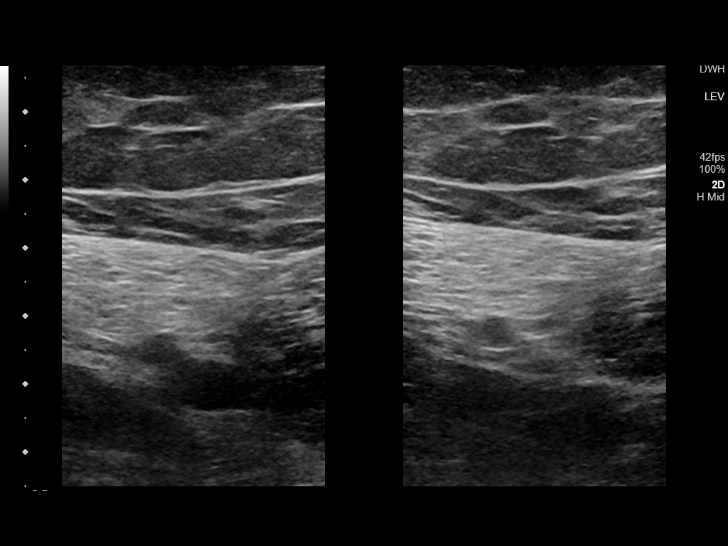
[im 21/34]
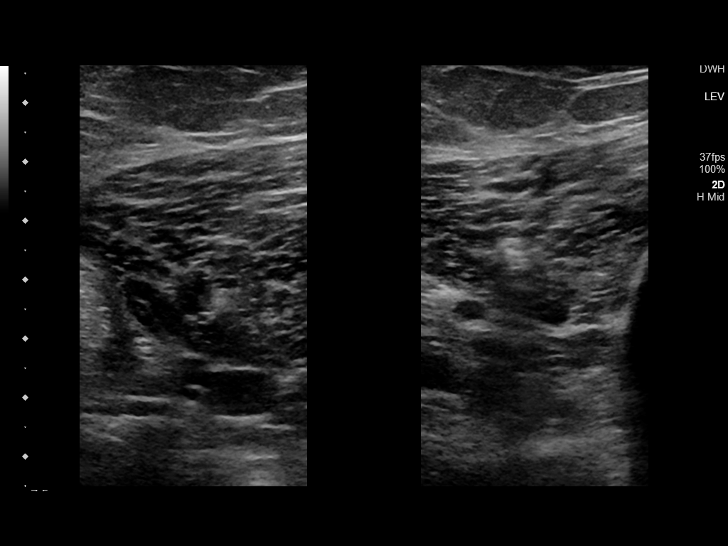
[im 23/34]
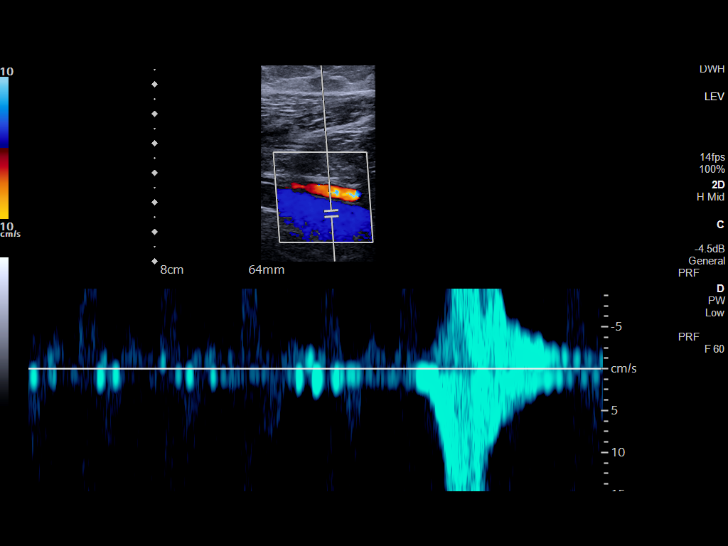
[im 26/34]
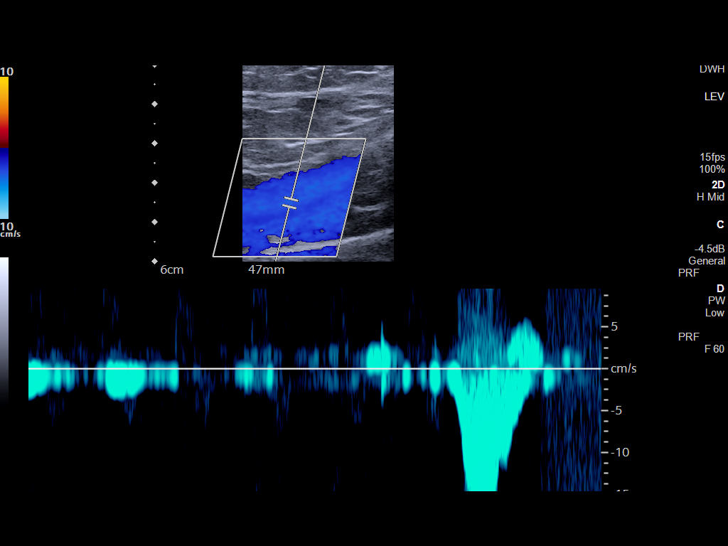
[im 28/34]
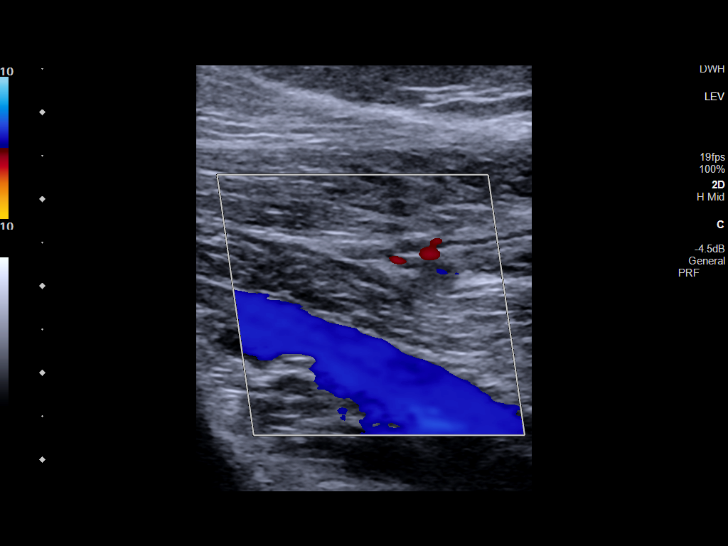
[im 31/34]
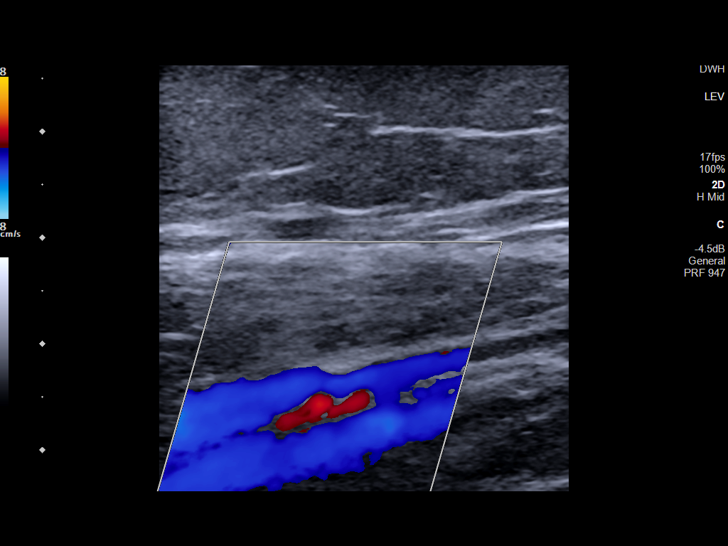
[im 34/34]
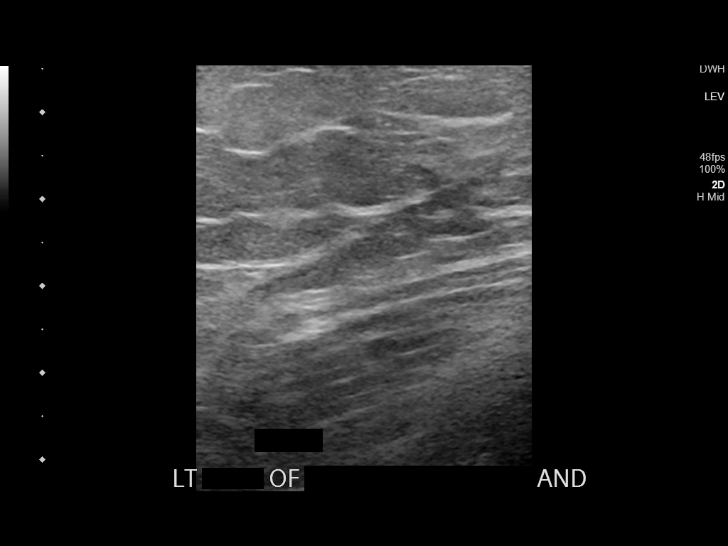

[14 of 24 positions shown; findings below may reference images not displayed]

FINDINGS: VENOUS

Normal compressibility of the common femoral, superficial femoral,
and popliteal veins, as well as the visualized calf veins.
Visualized portions of profunda femoral vein and great saphenous
vein unremarkable. No filling defects to suggest DVT on grayscale or
color Doppler imaging. Doppler waveforms show normal direction of
venous flow, normal respiratory plasticity and response to
augmentation.

Limited views of the contralateral common femoral vein are
unremarkable.

OTHER

Targeted sonographic evaluation in the area of redness and swelling
behind knee demonstrates no focal sonographic abnormality.

Limitations: none
IMPRESSION: No evidence of left lower extremity DVT.

## 2022-01-09 ENCOUNTER — Encounter: Payer: Medicaid Other | Admitting: Obstetrics and Gynecology

## 2022-01-09 DIAGNOSIS — Z01419 Encounter for gynecological examination (general) (routine) without abnormal findings: Secondary | ICD-10-CM

## 2022-11-01 ENCOUNTER — Emergency Department
Admission: EM | Admit: 2022-11-01 | Discharge: 2022-11-01 | Disposition: A | Discharge status: Home / Self Care | Payer: Medicaid Other | Attending: Student in an Organized Health Care Education/Training Program | Admitting: Student in an Organized Health Care Education/Training Program

## 2022-11-01 DIAGNOSIS — K0889 Other specified disorders of teeth and supporting structures: Secondary | ICD-10-CM | POA: Diagnosis present

## 2022-11-01 DIAGNOSIS — K047 Periapical abscess without sinus: Secondary | ICD-10-CM | POA: Insufficient documentation

## 2022-11-01 MED ORDER — AMOXICILLIN-POT CLAVULANATE 875-125 MG PO TABS: 1.0000 | ORAL_TABLET | Freq: Two times a day (BID) | ORAL | 0 refills | Status: AC

## 2022-11-01 MED ORDER — TRAMADOL HCL 50 MG PO TABS: 50.0000 mg | ORAL_TABLET | Freq: Four times a day (QID) | ORAL | 0 refills | Status: DC | PRN

## 2022-11-01 NOTE — ED Provider Notes (Signed)
[facility] Provider Note   CSN: [id number] Arrival date & time: 11/01/22  1513     History  Chief Complaint  Patient presents with   Dental Pain    Deborah Strong is a 27 y.o. female.  Presents to the emergency department for evaluation of dental pain.  Dental pain has been present for a couple of days.  She has not seen a dentist in a while.  She states she has a broken tooth in the right lower mandible.  She has not not had any facial swelling or fevers.  She is tolerating p.o. well.  She has had no improvement of right lower dental pain for the last few days.  She denies any rashes or facial numbness  HPI     Home Medications Prior to Admission medications   Medication Sig Start Date End Date Taking? Authorizing Provider  amoxicillin-clavulanate (AUGMENTIN) 875-125 MG tablet Take 1 tablet by mouth 2 (two) times daily for 10 days. 11/01/22 11/11/22 Yes Duanne Guess, PA-C  traMADol (ULTRAM) 50 MG tablet Take 1 tablet (50 mg total) by mouth every 6 (six) hours as needed. 11/01/22 11/01/23 Yes Duanne Guess, PA-C  desogestrel-ethinyl estradiol (MIRCETTE) 0.15-0.02/0.01 MG (21/5) tablet Take 1 tablet by mouth at bedtime. 07/05/20   Harlin Heys, MD      Allergies    Patient has no known allergies.    Review of Systems   Review of Systems  Physical Exam Updated Vital Signs BP (!) 136/101 (BP Location: Left Arm)   Pulse 88   Temp 97.7 F (36.5 C) (Oral)   Resp 18   Wt 96.2 kg   LMP 11/01/2022   SpO2 100%   BMI 33.20 kg/m  Physical Exam Constitutional:      Appearance: She is well-developed.  HENT:     Head: Normocephalic and atraumatic.     Mouth/Throat:     Mouth: Mucous membranes are moist.     Pharynx: No oropharyngeal exudate or posterior oropharyngeal erythema.     Comments: No pharyngeal erythema or exudates.  Right lower mandible second molar decayed.  Diffuse dental decay Eyes:     Conjunctiva/sclera:  Conjunctivae normal.  Cardiovascular:     Rate and Rhythm: Normal rate.  Pulmonary:     Effort: Pulmonary effort is normal. No respiratory distress.  Musculoskeletal:        General: Normal range of motion.     Cervical back: Normal range of motion.  Skin:    General: Skin is warm.     Findings: No rash.  Neurological:     Mental Status: She is alert and oriented to person, place, and time.  Psychiatric:        Behavior: Behavior normal.        Thought Content: Thought content normal.     ED Results / Procedures / Treatments   Labs (all labs ordered are listed, but only abnormal results are displayed) Labs Reviewed - No data to display  EKG None  Radiology No results found.  Procedures Procedures    Medications Ordered in ED Medications - No data to display  ED Course/ Medical Decision Making/ A&P                             Medical Decision Making Risk Prescription drug management.   27 year old female with dental infection.  No fluctuant abscess on exam.  Vital  signs are stable, afebrile.  Tolerating p.o. well.  She will continue with over-the-counter medications for mild to moderate pain and she is given tramadol for moderate to severe pain.  She will take Augmentin as prescribed and will keep dental clinic appointment for next week Final Clinical Impression(s) / ED Diagnoses Final diagnoses:  Pain, dental  Toothache  Dental infection    Rx / DC Orders ED Discharge Orders          Ordered    amoxicillin-clavulanate (AUGMENTIN) 875-125 MG tablet  2 times daily        11/01/22 1535    traMADol (ULTRAM) 50 MG tablet  Every 6 hours PRN        11/01/22 1536              Renata Caprice 11/01/22 1539    Merlyn Lot, MD 11/01/22 1907

## 2022-11-01 NOTE — ED Triage Notes (Signed)
Pt sts that she has a dental apt next week. Pt sts that she has pain on the bottom right.

## 2022-11-01 NOTE — Discharge Instructions (Addendum)
Please follow-up with dental clinic.  Take antibiotics as prescribed.  You may take Tylenol/ibuprofen as needed for mild to moderate pain.  Use tramadol as needed for severe pain only.  Please make sure you keep your follow-up appointment next week with dentist

## 2023-01-30 ENCOUNTER — Emergency Department
Admission: EM | Admit: 2023-01-30 | Discharge: 2023-01-30 | Disposition: A | Discharge status: Home / Self Care | Payer: Medicaid Other | Attending: Emergency Medicine | Admitting: Emergency Medicine

## 2023-01-30 ENCOUNTER — Other Ambulatory Visit: Payer: Self-pay

## 2023-01-30 DIAGNOSIS — X118XXA Contact with other hot tap-water, initial encounter: Secondary | ICD-10-CM | POA: Insufficient documentation

## 2023-01-30 DIAGNOSIS — T25222A Burn of second degree of left foot, initial encounter: Secondary | ICD-10-CM | POA: Insufficient documentation

## 2023-01-30 MED ORDER — CEPHALEXIN 500 MG PO CAPS: 500.0000 mg | ORAL_CAPSULE | Freq: Three times a day (TID) | ORAL | 0 refills | Status: DC

## 2023-01-30 MED ORDER — TRIPLE ANTIBIOTIC 3.5-400-5000 EX OINT
1.0000 | TOPICAL_OINTMENT | Freq: Once | CUTANEOUS | Status: AC
  Administered 2023-01-30: 1 via CUTANEOUS
  Filled 2023-01-30: qty 1

## 2023-01-30 NOTE — Discharge Instructions (Signed)
Continue to keep the area clean and dry.  Watch for any signs of infection.  Clean daily with mild soap and water and allowed to dry completely.  A prescription was sent to the pharmacy to keep this from getting infected.  If any worsening of this area or concerns for infection return to the emergency department or follow-up with [facility] urgent care.
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
## 2023-03-26 ENCOUNTER — Other Ambulatory Visit: Payer: Self-pay

## 2023-03-26 ENCOUNTER — Encounter: Payer: Self-pay | Admitting: Emergency Medicine

## 2023-03-26 ENCOUNTER — Emergency Department
Admission: EM | Admit: 2023-03-26 | Discharge: 2023-03-26 | Disposition: A | Discharge status: Home / Self Care | Payer: Medicaid Other | Attending: Emergency Medicine | Admitting: Emergency Medicine

## 2023-03-26 DIAGNOSIS — S39012A Strain of muscle, fascia and tendon of lower back, initial encounter: Secondary | ICD-10-CM | POA: Diagnosis not present

## 2023-03-26 DIAGNOSIS — W06XXXA Fall from bed, initial encounter: Secondary | ICD-10-CM | POA: Insufficient documentation

## 2023-03-26 DIAGNOSIS — S3992XA Unspecified injury of lower back, initial encounter: Secondary | ICD-10-CM | POA: Diagnosis present

## 2023-03-26 MED ORDER — IBUPROFEN 800 MG PO TABS
800.0000 mg | ORAL_TABLET | Freq: Once | ORAL | Status: AC
  Administered 2023-03-26: 800 mg via ORAL
  Filled 2023-03-26: qty 1

## 2023-03-26 MED ORDER — LIDOCAINE 5 % EX PTCH
1.0000 | MEDICATED_PATCH | Freq: Once | CUTANEOUS | Status: DC
  Administered 2023-03-26: 1 via TRANSDERMAL
  Filled 2023-03-26: qty 1

## 2023-03-26 MED ORDER — IBUPROFEN 800 MG PO TABS: 800.0000 mg | ORAL_TABLET | Freq: Three times a day (TID) | ORAL | 0 refills | Status: AC | PRN

## 2023-03-26 MED ORDER — LIDOCAINE 5 % EX PTCH: 1.0000 | MEDICATED_PATCH | Freq: Two times a day (BID) | CUTANEOUS | 0 refills | Status: AC | PRN

## 2023-03-26 MED ORDER — CYCLOBENZAPRINE HCL 5 MG PO TABS: 5.0000 mg | ORAL_TABLET | Freq: Three times a day (TID) | ORAL | 0 refills | Status: AC | PRN

## 2023-03-26 NOTE — ED Provider Notes (Signed)
[facility] Emergency Department Provider Note     Event Date/Time   First MD Initiated Contact with Patient 03/26/23 2155     (approximate)   History   Back Pain   HPI  Deborah Strong is a 27 y.o. female with a noncontributory medical history, presents to the ED via POV with complaints of low back pain.  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  Patient reports the incident occurred last night around 6:30 PM.  She denies any immediate pain or disability related to the injury.  She does describe delayed onset of muscle soreness and stiffness associated with lumbar flexion and extension range.  Patient took Tylenol around 62 with some minimal improvement.  She presents to the ED for evaluation of ongoing low back pain.  Rates pain at a 4-10 at this time.  She denies any bladder or bowel incontinence, foot drop, or saddle anesthesias.     Physical Exam   Triage Vital Signs: ED Triage Vitals  Enc Vitals Group     BP 03/26/23 2140 122/78     Pulse Rate 03/26/23 2140 88     Resp 03/26/23 2140 18     Temp 03/26/23 2140 97.8 F (36.6 C)     Temp Source 03/26/23 2140 Oral     SpO2 03/26/23 2140 98 %     Weight 03/26/23 2139 201 lb 1 oz (91.2 kg)     Height 03/26/23 2139 5\' 7"  (1.702 m)     Head Circumference --      Peak Flow --      Pain Score 03/26/23 2140 4     Pain Loc --      Pain Edu? --      Excl. in GC? --     Most recent vital signs: Vitals:   03/26/23 2140  BP: 122/78  Pulse: 88  Resp: 18  Temp: 97.8 F (36.6 C)  SpO2: 98%    General Awake, no distress. NAD CV:  Good peripheral perfusion.  RESP:  Normal effort.  ABD:  No distention.  MSK:  Normal spinal alignment without midline tenderness, spasm, deformity, or step-off.  Patient nontender to palpation of the midline lumbar sacral junction.  Normal transition from sit to stand.  Patient with a normal gait  without ataxia.  Full active motion of lower extremities noted bilaterally.  Normal lumbar patient extension range on exam. NEURO: Cranial nerves II to XII grossly intact.  Normal LE DTRs bilaterally.  No cerebellar ataxia appreciated.   ED Results / Procedures / Treatments   Labs (all labs ordered are listed, but only abnormal results are displayed) Labs Reviewed - No data to display   EKG   RADIOLOGY  No results found.   PROCEDURES:  Critical Care performed: No  Procedures   MEDICATIONS ORDERED IN ED: Medications  lidocaine (LIDODERM) 5 % 1 patch (1 patch Transdermal Patch Applied 03/26/23 2249)  ibuprofen (ADVIL) tablet 800 mg (800 mg Oral Given 03/26/23 2249)     IMPRESSION / MDM / ASSESSMENT AND PLAN / ED COURSE  I reviewed the triage vital signs and the nursing notes.                              Differential diagnosis includes, but is not limited to, lumbar strain, lumbar radiculopathy, ileitis  Patient's presentation is most  consistent with acute, uncomplicated illness.  Patient's diagnosis is consistent with musculoskeletal lumbosacral strain.  Red flags on exam.  Patient with reassuring exam workup with no neurodeficits noted.  No indication at this time for imaging.  Patient will be discharged home with prescriptions for ibuprofen Lidoderm patches, and Flexeril. Patient is to follow up with [facility] urgent care or her [employer]. approved provider, as needed or otherwise directed. Patient is given ED precautions to return to the ED for any worsening or new symptoms.   FINAL CLINICAL IMPRESSION(S) / ED DIAGNOSES   Final diagnoses:  Strain of lumbar region, initial encounter     Rx / DC Orders   ED Discharge Orders          Ordered    cyclobenzaprine (FLEXERIL) 5 MG tablet  3 times daily PRN        03/26/23 2242    ibuprofen (ADVIL) 800 MG tablet  Every 8 hours PRN        03/26/23 2242    lidocaine (LIDODERM) 5 %  Every 12 hours PRN         03/26/23 2242             Note:  This document was prepared using Dragon voice recognition software and may include unintentional dictation errors.    Lissa Hoard, PA-C 03/26/23 2257    Georga Hacking, MD 03/26/23 2300

## 2023-03-26 NOTE — Discharge Instructions (Signed)
Your exam is consistent with a lumbar strain.  Take the prescription anti-inflammatory muscle relaxants as directed.  Use lidocaine patches as prescribed.  Apply ice or moist heat to low back to help reduce muscle tension pain.  Follow-up with [facility] urgent care, or the medical clinic approved by [employer]. provider.

## 2023-03-26 NOTE — ED Triage Notes (Signed)
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 She notes taking tylenol around 1900 with mild improvement. Rates the pain 4/10. A&Ox4 at this time. Denies CP or SOB.

## 2023-06-27 ENCOUNTER — Other Ambulatory Visit: Payer: Self-pay

## 2023-06-27 ENCOUNTER — Encounter: Payer: Self-pay | Admitting: Emergency Medicine

## 2023-06-27 DIAGNOSIS — N3000 Acute cystitis without hematuria: Secondary | ICD-10-CM | POA: Diagnosis not present

## 2023-06-27 DIAGNOSIS — R300 Dysuria: Secondary | ICD-10-CM | POA: Diagnosis present

## 2023-06-27 LAB — URINALYSIS, ROUTINE W REFLEX MICROSCOPIC
Bilirubin Urine: NEGATIVE
Glucose, UA: NEGATIVE mg/dL
Ketones, ur: NEGATIVE mg/dL
Leukocytes,Ua: NEGATIVE
Nitrite: POSITIVE — AB
Protein, ur: 100 mg/dL — AB
Specific Gravity, Urine: 1.011 (ref 1.005–1.030)
WBC, UA: >50 WBC/hpf (ref 0–5)
pH: 5 (ref 5.0–8.0)

## 2023-06-27 LAB — POC URINE PREG, ED: Preg Test, Ur: NEGATIVE

## 2023-06-27 NOTE — ED Triage Notes (Signed)
Pt presents ambulatory to triage via POV with complaints of dysuria x 3 days with associated pelvic pain. She notes trying OTC Azo and has had some increased pain after taking the meds. A&Ox4 at this time. Denies CP or SOB.

## 2023-06-28 ENCOUNTER — Emergency Department
Admission: EM | Admit: 2023-06-28 | Discharge: 2023-06-28 | Disposition: A | Discharge status: Home / Self Care | Payer: Medicaid Other | Attending: Emergency Medicine | Admitting: Emergency Medicine

## 2023-06-28 DIAGNOSIS — N3000 Acute cystitis without hematuria: Secondary | ICD-10-CM

## 2023-06-28 MED ORDER — CEPHALEXIN 500 MG PO CAPS: 500.0000 mg | ORAL_CAPSULE | Freq: Three times a day (TID) | ORAL | 0 refills | Status: AC

## 2023-06-28 MED ORDER — CEPHALEXIN 500 MG PO CAPS
500.0000 mg | ORAL_CAPSULE | Freq: Once | ORAL | Status: AC
  Administered 2023-06-28: 500 mg via ORAL
  Filled 2023-06-28: qty 1

## 2023-06-28 NOTE — ED Provider Notes (Signed)
[facility] Provider Note    Event Date/Time   First MD Initiated Contact with Patient 06/28/23 0012     (approximate)   History   Dysuria   HPI  Deborah Strong is a 27 y.o. female who presents to the ED for evaluation of Dysuria   Patient presents for evaluation of 3 days of dysuria and pelvic cramping with urination.  No fevers or flank pain.  No emesis or stool changes.  No vaginal discharge   Physical Exam   Triage Vital Signs: ED Triage Vitals  Encounter Vitals Group     BP 06/27/23 2330 (!) 122/90     Systolic BP Percentile --      Diastolic BP Percentile --      Pulse Rate 06/27/23 2330 90     Resp 06/27/23 2330 18     Temp 06/27/23 2330 98.6 F (37 C)     Temp Source 06/27/23 2330 Oral     SpO2 06/27/23 2330 99 %     Weight 06/27/23 2329 210 lb (95.3 kg)     Height 06/27/23 2329 5\' 7"  (1.702 m)     Head Circumference --      Peak Flow --      Pain Score --      Pain Loc --      Pain Education --      Exclude from Growth Chart --     Most recent vital signs: Vitals:   06/27/23 2330  BP: (!) 122/90  Pulse: 90  Resp: 18  Temp: 98.6 F (37 C)  SpO2: 99%    General: Awake, no distress.  CV:  Good peripheral perfusion.  Resp:  Normal effort.  Abd:  No distention.  Minimal suprapubic tenderness, otherwise benign MSK:  No deformity noted.  Neuro:  No focal deficits appreciated. Other:     ED Results / Procedures / Treatments   Labs (all labs ordered are listed, but only abnormal results are displayed) Labs Reviewed  URINALYSIS, ROUTINE W REFLEX MICROSCOPIC - Abnormal; Notable for the following components:      Result Value   Color, Urine AMBER (*)    APPearance CLOUDY (*)    Hgb urine dipstick MODERATE (*)    Protein, ur 100 (*)    Nitrite POSITIVE (*)    Bacteria, UA FEW (*)    Non Squamous Epithelial PRESENT (*)    All other components within normal limits  URINE CULTURE  POC URINE PREG, ED     EKG   RADIOLOGY   Official radiology report(s): No results found.  PROCEDURES and INTERVENTIONS:  Procedures  Medications  cephALEXin (KEFLEX) capsule 500 mg (500 mg Oral Given 06/28/23 0119)     IMPRESSION / MDM / ASSESSMENT AND PLAN / ED COURSE  I reviewed the triage vital signs and the nursing notes.  Differential diagnosis includes, but is not limited to, acute cystitis, PID, pyelonephritis, sepsis, appendicitis  {Patient presents with symptoms of an acute illness or injury that is potentially life-threatening.  Patient presents with dysuria and evidence of acute cystitis suitable for outpatient management.  No fevers, systemic symptoms or vital sign derangements to necessitate a serum workup to evaluate for sepsis.  She looks well.  Localized tenderness on exam.  She is not pregnant and UA with infectious features, sent for culture and patient is started on Keflex.  Discharged with the same and we discussed return precautions.      FINAL CLINICAL IMPRESSION(S) /  ED DIAGNOSES   Final diagnoses:  Acute cystitis without hematuria     Rx / DC Orders   ED Discharge Orders          Ordered    cephALEXin (KEFLEX) 500 MG capsule  3 times daily        06/28/23 0137             Note:  This document was prepared using Dragon voice recognition software and may include unintentional dictation errors.   Delton Prairie, MD 06/28/23 [phone]

## 2023-06-28 NOTE — ED Notes (Signed)
Patient discharged at this time. Ambulated to lobby with independent and steady gait. Breathing unlabored speaking in full sentences. Verbalized understanding of all discharge, follow up, and medication teaching. Discharged homed with all belongings.   

## 2023-06-28 NOTE — Discharge Instructions (Addendum)
Keflex antibiotic 3 times daily for 5 days to treat UTI  Please take Tylenol and ibuprofen/Advil for your pain.  It is safe to take them together, or to alternate them every few hours.  Take up to 1000mg  of Tylenol at a time, up to 4 times per day.  Do not take more than 4000 mg of Tylenol in 24 hours.  For ibuprofen, take 400-600 mg, 3 - 4 times per day.

## 2023-06-30 LAB — URINE CULTURE: Culture: 20000 — AB

## 2024-03-26 ENCOUNTER — Other Ambulatory Visit: Payer: Self-pay

## 2024-03-26 ENCOUNTER — Emergency Department: Admission: EM | Admit: 2024-03-26 | Discharge: 2024-03-26 | Disposition: A | Discharge status: Home / Self Care

## 2024-03-26 DIAGNOSIS — R112 Nausea with vomiting, unspecified: Secondary | ICD-10-CM | POA: Diagnosis present

## 2024-03-26 DIAGNOSIS — R197 Diarrhea, unspecified: Secondary | ICD-10-CM | POA: Insufficient documentation

## 2024-03-26 LAB — COMPREHENSIVE METABOLIC PANEL WITH GFR
ALT: 12 U/L (ref 0–44)
AST: 17 U/L (ref 15–41)
Albumin: 3.5 g/dL (ref 3.5–5.0)
Alkaline Phosphatase: 66 U/L (ref 38–126)
Anion gap: 11 (ref 5–15)
BUN: 18 mg/dL (ref 6–20)
CO2: 20 mmol/L — ABNORMAL LOW (ref 22–32)
Calcium: 8.9 mg/dL (ref 8.9–10.3)
Chloride: 106 mmol/L (ref 98–111)
Creatinine, Ser: 0.68 mg/dL (ref 0.44–1.00)
GFR, Estimated: >60 mL/min (ref >60)
Glucose, Bld: 93 mg/dL (ref 70–99)
Potassium: 3.5 mmol/L (ref 3.5–5.1)
Sodium: 137 mmol/L (ref 135–145)
Total Bilirubin: 0.7 mg/dL (ref 0.0–1.2)
Total Protein: 8.6 g/dL — ABNORMAL HIGH (ref 6.5–8.1)

## 2024-03-26 LAB — CBC
HCT: 40.4 % (ref 36.0–46.0)
Hemoglobin: 13 g/dL (ref 12.0–15.0)
MCH: 26.9 pg (ref 26.0–34.0)
MCHC: 32.2 g/dL (ref 30.0–36.0)
MCV: 83.5 fL (ref 80.0–100.0)
Platelets: 245 10*3/uL (ref 150–400)
RBC: 4.84 MIL/uL (ref 3.87–5.11)
RDW: 14.7 % (ref 11.5–15.5)
WBC: 5.8 10*3/uL (ref 4.0–10.5)
nRBC: 0 % (ref 0.0–0.2)

## 2024-03-26 LAB — URINALYSIS, ROUTINE W REFLEX MICROSCOPIC
Bacteria, UA: NONE SEEN
Bilirubin Urine: NEGATIVE
Glucose, UA: NEGATIVE mg/dL
Hgb urine dipstick: NEGATIVE
Ketones, ur: 20 mg/dL — AB
Nitrite: NEGATIVE
Protein, ur: 100 mg/dL — AB
Specific Gravity, Urine: 1.04 — ABNORMAL HIGH (ref 1.005–1.030)
Squamous Epithelial / HPF: >50 /HPF (ref 0–5)
pH: 5 (ref 5.0–8.0)

## 2024-03-26 LAB — LIPASE, BLOOD: Lipase: 25 U/L (ref 11–51)

## 2024-03-26 LAB — POC URINE PREG, ED: Preg Test, Ur: NEGATIVE

## 2024-03-26 MED ORDER — LOPERAMIDE HCL 2 MG PO TABS: 2.0000 mg | ORAL_TABLET | Freq: Four times a day (QID) | ORAL | 0 refills | Status: AC | PRN

## 2024-03-26 MED ORDER — ACETAMINOPHEN 500 MG PO TABS: 1000.0000 mg | ORAL_TABLET | Freq: Four times a day (QID) | ORAL | 0 refills | Status: AC | PRN

## 2024-03-26 MED ORDER — ONDANSETRON 4 MG PO TBDP: 4.0000 mg | ORAL_TABLET | Freq: Three times a day (TID) | ORAL | 0 refills | Status: AC | PRN

## 2024-03-26 NOTE — ED Triage Notes (Signed)
 Pt states ABD, N/V/D, pt states started last night. Pt denies urinary S/S. Pt denies fevers. NAD noted. Pt states possibly pregnant.

## 2024-03-26 NOTE — Discharge Instructions (Addendum)
 Your evaluation in the emergency department was overall reassuring.  I suspect you likely have a viral illness, and that should improve on its own within the next 1-2 days.  I prescribed you a nausea medication as well as a diarrhea medication to use as needed.  You can use Tylenol  as needed as well for any recurrent discomfort.  Continue to drink plenty of fluids and to follow-up with your primary care provider for any ongoing symptoms.  Return to the emergency department with any new or worsening symptoms.

## 2024-03-26 NOTE — ED Provider Notes (Signed)
 [facility] Provider Note    Event Date/Time   First MD Initiated Contact with Patient 03/26/24 1817     (approximate)   History   Emesis and Abdominal Pain  Pt states ABD, N/V/D, pt states started last night. Pt denies urinary S/S. Pt denies fevers. NAD noted. Pt states possibly pregnant.    HPI Deborah Strong is a 28 y.o. female presents for evaluation of nausea/vomiting/diarrhea as well as abdominal discomfort -Present since yesterday evening.  Was having some periumbilical abdominal discomfort earlier today, somewhat improved after diarrhea.  Few episodes of vomiting.  No blood/melena. -Currently feeling better at time of my eval -No known sick contacts -No obvious culprit foods -No urinary symptoms -No vaginal bleeding or discharge     Physical Exam   Triage Vital Signs: ED Triage Vitals [03/26/24 1642]  Encounter Vitals Group     BP 118/83     Girls Systolic BP Percentile      Girls Diastolic BP Percentile      Boys Systolic BP Percentile      Boys Diastolic BP Percentile      Pulse Rate (!) 110     Resp 17     Temp 98.2 F (36.8 C)     Temp Source Oral     SpO2 100 %     Weight 212 lb (96.2 kg)     Height 5' 7 (1.702 m)     Head Circumference      Peak Flow      Pain Score 10     Pain Loc      Pain Education      Exclude from Growth Chart     Most recent vital signs: Vitals:   03/26/24 1642  BP: 118/83  Pulse: (!) 110  Resp: 17  Temp: 98.2 F (36.8 C)  SpO2: 100%     General: Awake, no distress.  CV:  Good peripheral perfusion.  Regular rhythm, heart rate 90s at time of my eval.  RP 2+. Resp:  Normal effort. CTAB Abd:  No distention. Nontender to deep palpation throughout.  No CVA tenderness bilaterally.    ED Results / Procedures / Treatments   Labs (all labs ordered are listed, but only abnormal results are displayed) Labs Reviewed  COMPREHENSIVE METABOLIC PANEL WITH GFR - Abnormal; Notable for the  following components:      Result Value   CO2 20 (*)    Total Protein 8.6 (*)    All other components within normal limits  URINALYSIS, ROUTINE W REFLEX MICROSCOPIC - Abnormal; Notable for the following components:   Color, Urine AMBER (*)    APPearance CLOUDY (*)    Specific Gravity, Urine 1.040 (*)    Ketones, ur 20 (*)    Protein, ur 100 (*)    Leukocytes,Ua MODERATE (*)    All other components within normal limits  LIPASE, BLOOD  CBC  POC URINE PREG, ED     EKG  N/a   RADIOLOGY N/a    PROCEDURES:  Critical Care performed: No  Procedures   MEDICATIONS ORDERED IN ED: Medications - No data to display   IMPRESSION / MDM / ASSESSMENT AND PLAN / ED COURSE  I reviewed the triage vital signs and the nursing notes.                              DDX/MDM/AP: Differential diagnosis includes, but is  not limited to, likely viral gastroenteritis or possible food poisoning, do not suspect acute intra-abdominal pathology with no tenderness to deep palpation throughout abdomen on my eval.  Doubt underlying UTI given no associated urinary symptoms.  Plan: - Labs - reassess  Patient's presentation is most consistent with acute complicated illness / injury requiring diagnostic workup.   ED course below.  Laboratory workup unremarkable, hepatobiliary labs normal, no leukocytosis.  Urinalysis contaminated, treatment deferred in setting of no associated urinary symptoms.  Overall suspect likely viral syndrome, declined viral testing.  Rx Zofran, Imodium.  Plan for PMD follow-up.  ED return precautions in place.  Patient agrees with plan.  Clinical Course as of 03/26/24 1913  Thu Mar 26, 2024  1903 CBC, CMP, lipase reviewed, unremarkable.  Urinalysis grossly contaminated, negative nitrite.  Patient with no clear urinary symptoms, no indication for treatment at this time.  hCG negative [MM]    Clinical Course User Index [MM] Collis Deaner, MD     FINAL CLINICAL  IMPRESSION(S) / ED DIAGNOSES   Final diagnoses:  Nausea vomiting and diarrhea     Rx / DC Orders   ED Discharge Orders          Ordered    loperamide (IMODIUM A-D) 2 MG tablet  4 times daily PRN        03/26/24 1907    ondansetron (ZOFRAN-ODT) 4 MG disintegrating tablet  Every 8 hours PRN        03/26/24 1907    acetaminophen  (TYLENOL ) 500 MG tablet  Every 6 hours PRN        03/26/24 1907             Note:  This document was prepared using Dragon voice recognition software and may include unintentional dictation errors.   Collis Deaner, MD 03/26/24 [id number]

## 2024-03-26 NOTE — ED Notes (Signed)
 ED Provider at bedside.
